# Patient Record
Sex: Male | Born: 1955 | Hispanic: Yes | Marital: Married | Smoking: Former smoker
Health system: Southern US, Community
[De-identification: ages and names within clinical notes are randomized; demographics above are authoritative.]

## PROBLEM LIST (undated history)

## (undated) DIAGNOSIS — D126 Benign neoplasm of colon, unspecified: Secondary | ICD-10-CM

## (undated) DIAGNOSIS — K219 Gastro-esophageal reflux disease without esophagitis: Secondary | ICD-10-CM

## (undated) DIAGNOSIS — E785 Hyperlipidemia, unspecified: Secondary | ICD-10-CM

## (undated) DIAGNOSIS — R51 Headache: Secondary | ICD-10-CM

## (undated) DIAGNOSIS — R519 Headache, unspecified: Secondary | ICD-10-CM

## (undated) DIAGNOSIS — T7840XA Allergy, unspecified, initial encounter: Secondary | ICD-10-CM

## (undated) HISTORY — DX: Hyperlipidemia, unspecified: E78.5

## (undated) HISTORY — DX: Headache, unspecified: R51.9

## (undated) HISTORY — DX: Gastro-esophageal reflux disease without esophagitis: K21.9

## (undated) HISTORY — DX: Benign neoplasm of colon, unspecified: D12.6

## (undated) HISTORY — PX: COLONOSCOPY: SHX174

## (undated) HISTORY — DX: Allergy, unspecified, initial encounter: T78.40XA

## (undated) HISTORY — PX: TONSILLECTOMY: SUR1361

## (undated) HISTORY — DX: Headache: R51

---

## 2012-05-01 ENCOUNTER — Ambulatory Visit (INDEPENDENT_AMBULATORY_CARE_PROVIDER_SITE_OTHER): Payer: BC Managed Care – PPO | Admitting: Family Medicine

## 2012-05-01 ENCOUNTER — Encounter: Payer: Self-pay | Admitting: Gastroenterology

## 2012-05-01 ENCOUNTER — Ambulatory Visit (INDEPENDENT_AMBULATORY_CARE_PROVIDER_SITE_OTHER): Payer: Self-pay | Admitting: Gastroenterology

## 2012-05-01 VITALS — BP 118/60 | HR 80 | Ht 68.0 in | Wt 134.0 lb

## 2012-05-01 VITALS — BP 113/78 | HR 108 | Temp 98.4°F | Resp 20 | Ht 68.0 in | Wt 135.0 lb

## 2012-05-01 DIAGNOSIS — K921 Melena: Secondary | ICD-10-CM

## 2012-05-01 DIAGNOSIS — D62 Acute posthemorrhagic anemia: Secondary | ICD-10-CM | POA: Insufficient documentation

## 2012-05-01 LAB — POCT CBC
HCT, POC: 31.6 % — AB (ref 43.5–53.7)
MCH, POC: 29.6 pg (ref 27–31.2)
MCV: 90.9 fL (ref 80–97)
MID (cbc): 0.4 (ref 0–0.9)
POC LYMPH PERCENT: 31.6 %L (ref 10–50)
Platelet Count, POC: 204 10*3/uL (ref 142–424)
RDW, POC: 14.8 %
WBC: 8.2 10*3/uL (ref 4.6–10.2)

## 2012-05-01 MED ORDER — PEG-KCL-NACL-NASULF-NA ASC-C 100 G PO SOLR: 1.0000 | Freq: Once | ORAL | Status: DC

## 2012-05-01 NOTE — Assessment & Plan Note (Addendum)
The patient has had an acute GI bleed. It is not certain whether this is due to  an upper GI or lower GI source. Intermittent use of Aleve stomach predisposes him to peptic ulcer disease.  Recommendations #1 begin Nexium 40 mg twice a day #2 upper endoscopy and colonoscopy-to be done within the next 24 hours  The patient was carefully instructed to go to the emergency room should he start passing clots or feel weak and dizzy

## 2012-05-01 NOTE — Patient Instructions (Signed)
Go to [facility]- appt to see Dr. Arlyce Dice at 2:15 [address] [place]

## 2012-05-01 NOTE — Progress Notes (Signed)
Patient Name: Dustin Schmidt Date of Birth: Mar 30, 1956 Medical Record Number: [id number] Gender: male Date of Encounter: 05/01/2012  History of Present Illness:  Dustin Schmidt is a 56 y.o. very pleasant male patient who presents with the following:  He is here to evaluate rectal bleeding and melena.  He has had a few stools over the last couple of days and they have ranged from bright red to black, and have smelled bad.  He has never had this before.  He has noted fatigue and feels a little dizzy especially over the last 24 hours.   His appetite has been ok, but he has avoided eating due to being afraid that he will then have a BM and "will have to see the blood" again. He has not had any vomiting or hematemesis.  He has not been taking a lot of NSAIDs or aspirin, no family history of colon cancer or other GI problems   There is no problem list on file for this patient.  No past medical history on file. No past surgical history on file. History  Substance Use Topics  . Smoking status: Never Smoker   . Smokeless tobacco: Not on file  . Alcohol Use: Not on file   No family history on file. Allergies  Allergen Reactions  . Alka-Seltzer Plus Sinus (Phenylephrine-Aspirin) Hives and Itching    Plain alka-seltzer.  . Shellfish Allergy Anaphylaxis    Shrimp.    Medication list has been reviewed and updated.  Review of Systems: As per HPI- otherwise negative.Marland Kitchen   Physical Examination: Filed Vitals:   05/01/12 1131  BP: 115/72  Pulse: 86  Temp: 98.4 F (36.9 C)  TempSrc: Oral  Resp: 20  Height: 5\' 8"  (1.727 m)  Weight: 135 lb (61.236 kg)    Body mass index is 20.53 kg/(m^2).  GEN: WDWN, NAD, Non-toxic, A & O x 3 HEENT: Atraumatic, Normocephalic. Neck supple. No masses, No LAD.  PEERL, oropharynx wnl  Ears and Nose: No external deformity. CV: RRR, No M/G/R. No JVD. No thrill. No extra heart sounds. PULM: CTA B, no wheezes, crackles, rhonchi. No  retractions. No resp. distress. No accessory muscle use. ABD: S, NT, ND, +BS. No rebound. No HSM. EXTR: No c/c/e NEURO Normal gait.  PSYCH: Normally interactive. Conversant. Not depressed or anxious appearing.  Calm demeanor.  Rectal exam: no gross bleeding, black stool on rectal exam, no fissure or pain to suggest a fissure  Results for orders placed in visit on 05/01/12  POCT CBC      Component Value Range   WBC 8.2  4.6 - 10.2 (K/uL)   Lymph, poc 2.6  0.6 - 3.4    POC LYMPH PERCENT 31.6  10 - 50 (%L)   MID (cbc) 0.4  0 - 0.9    POC MID % 4.3  0 - 12 (%M)   POC Granulocyte 5.3  2 - 6.9    Granulocyte percent 64.1  37 - 80 (%G)   RBC 3.48 (*) 4.69 - 6.13 (M/uL)   Hemoglobin 10.3 (*) 14.1 - 18.1 (g/dL)   HCT, POC 08.6 (*) 57.8 - 53.7 (%)   MCV 90.9  80 - 97 (fL)   MCH, POC 29.6  27 - 31.2 (pg)   MCHC 32.6  31.8 - 35.4 (g/dL)   RDW, POC 46.9     Platelet Count, POC 204  142 - 424 (K/uL)   MPV 9.1  0 - 99.8 (fL)  IFOBT (OCCULT BLOOD)  Component Value Range   IFOBT Positive     Assessment and Plan: 1. Melena  POCT CBC, IFOBT POC (occult bld, rslt in office)  56 year old male with rectal bleeding and anemia- called [facility] and they kindly agreed to see him at 2:15.  Patient and his girlfriend will be at [facility] then, they appreciate appointment

## 2012-05-01 NOTE — Patient Instructions (Signed)
Colonoscopy A colonoscopy is an exam to evaluate your entire colon. In this exam, your colon is cleansed. A long fiberoptic tube is inserted through your rectum and into your colon. The fiberoptic scope (endoscope) is a long bundle of enclosed and very flexible fibers. These fibers transmit light to the area examined and send images from that area to your caregiver. Discomfort is usually minimal. You may be given a drug to help you sleep (sedative) during or prior to the procedure. This exam helps to detect lumps (tumors), polyps, inflammation, and areas of bleeding. Your caregiver may also take a small piece of tissue (biopsy) that will be examined under a microscope. LET YOUR CAREGIVER KNOW ABOUT:   Allergies to food or medicine.   Medicines taken, including vitamins, herbs, eyedrops, over-the-counter medicines, and creams.   Use of steroids (by mouth or creams).   Previous problems with anesthetics or numbing medicines.   History of bleeding problems or blood clots.   Previous surgery.   Other health problems, including diabetes and kidney problems.   Possibility of pregnancy, if this applies.  BEFORE THE PROCEDURE   A clear liquid diet may be required for 2 days before the exam.   Ask your caregiver about changing or stopping your regular medications.   Liquid injections (enemas) or laxatives may be required.   A large amount of electrolyte solution may be given to you to drink over a short period of time. This solution is used to clean out your colon.   You should be present 60 minutes prior to your procedure or as directed by your caregiver.  AFTER THE PROCEDURE   If you received a sedative or pain relieving medication, you will need to arrange for someone to drive you home.   Occasionally, there is a little blood passed with the first bowel movement. Do not be concerned.  FINDING OUT THE RESULTS OF YOUR TEST Not all test results are available during your visit. If your test  results are not back during the visit, make an appointment with your caregiver to find out the results. Do not assume everything is normal if you have not heard from your caregiver or the medical facility. It is important for you to follow up on all of your test results. HOME CARE INSTRUCTIONS   It is not unusual to pass moderate amounts of gas and experience mild abdominal cramping following the procedure. This is due to air being used to inflate your colon during the exam. Walking or a warm pack on your belly (abdomen) may help.   You may resume all normal meals and activities after sedatives and medicines have worn off.   Only take over-the-counter or prescription medicines for pain, discomfort, or fever as directed by your caregiver. Do not use aspirin or blood thinners if a biopsy was taken. Consult your caregiver for medicine usage if biopsies were taken.  SEEK IMMEDIATE MEDICAL CARE IF:   You have a fever.   You pass large blood clots or fill a toilet with blood following the procedure. This may also occur 10 to 14 days following the procedure. This is more likely if a biopsy was taken.   You develop abdominal pain that keeps getting worse and cannot be relieved with medicine.  Document Released: 11/18/2000 Document Revised: 11/10/2011 Document Reviewed: 07/03/2008 ExitCare Patient Information 2012 [employer].  Upper GI Endoscopy Upper GI endoscopy means using a flexible scope to look at the esophagus, stomach, and upper small bowel. This is   done to make a diagnosis in people with heartburn, abdominal pain, or abnormal bleeding. Sometimes an endoscope is needed to remove foreign bodies or food that become stuck in the esophagus; it can also be used to take biopsy samples. For the best results, do not eat or drink for 8 hours before having your upper endoscopy.  To perform the endoscopy, you will probably be sedated and your throat will be numbed with a special spray. The endoscope is  then slowly passed down your throat (this will not interfere with your breathing). An endoscopy exam takes 15 to 30 minutes to complete and there is no real pain. Patients rarely remember much about the procedure. The results of the test may take several days if a biopsy or other test is taken.  You may have a sore throat after an endoscopy exam. Serious complications are very rare. Stick to liquids and soft foods until your pain is better. Do not drive a car or operate any dangerous equipment for at least 24 hours after being sedated. SEEK IMMEDIATE MEDICAL CARE IF:   You have severe throat pain.   You have shortness of breath.   You have bleeding problems.   You have a fever.   You have difficulty recovering from your sedation.  Document Released: 12/29/2004 Document Revised: 11/10/2011 Document Reviewed: 11/23/2008 ExitCare Patient Information 2012 [employer]. 

## 2012-05-01 NOTE — Progress Notes (Signed)
History of Present Illness: Dustin Schmidt is a pleasant 56 year old Hispanic male referred from [facility] for evaluation of GI bleeding. 2 days ago he passed bright red blood per rectum consisting of liquid red stool followed by a second stool. Yesterday he had a black, solid stool followed by liquid stool. Hemoglobin today was 10.1 with normal MCV.  He tested Hemoccult positive. He complains of easy fatigability. He takes Aleve at least one to 2 times a week.  There is no history of peptic ulcer disease or change of bowel habits prior to this recent event. He has occasional pyrosis.     Past Medical History  Diagnosis Date  . Generalized headaches   . GERD (gastroesophageal reflux disease)    Past Surgical History  Procedure Date  . Tonsillectomy    family history is negative for Colon cancer. Current Outpatient Prescriptions  Medication Sig Dispense Refill  . Naproxen Sodium (ALEVE) 220 MG CAPS Take by mouth as needed.       Allergies as of 05/01/2012 - Review Complete 05/01/2012  Allergen Reaction Noted  . Alka-seltzer plus sinus (phenylephrine-aspirin) Hives and Itching 05/01/2012  . Shellfish allergy Anaphylaxis 05/01/2012    reports that he has never smoked. He has never used smokeless tobacco. He reports that he drinks alcohol. He reports that he does not use illicit drugs.     Review of Systems: Pertinent positive and negative review of systems were noted in the above HPI section. All other review of systems were otherwise negative.  Vital signs were reviewed in today's medical record Physical Exam: General: Well developed , well nourished, no acute distress Head: Normocephalic and atraumatic Eyes:  sclerae anicteric, EOMI Ears: Normal auditory acuity Mouth: No deformity or lesions Neck: Supple, no masses or thyromegaly Lungs: Clear throughout to auscultation Heart: Regular rate and rhythm; no murmurs, rubs or bruits Abdomen: Soft, non tender and non distended. No  masses, hepatosplenomegaly or hernias noted. Normal Bowel sounds Rectal:deferred Musculoskeletal: Symmetrical with no gross deformities  Skin: No lesions on visible extremities Pulses:  Normal pulses noted Extremities: No clubbing, cyanosis, edema or deformities noted Neurological: Alert oriented x 4, grossly nonfocal Cervical Nodes:  No significant cervical adenopathy Inguinal Nodes: No significant inguinal adenopathy Psychological:  Alert and cooperative. Normal mood and affect

## 2012-05-01 NOTE — Assessment & Plan Note (Addendum)
The patient has not had any further frank bleeding requiring  admission. Plan to be begin iron supplementation after his procedures tomorrow.

## 2012-05-02 ENCOUNTER — Encounter: Payer: Self-pay | Admitting: Gastroenterology

## 2012-05-02 ENCOUNTER — Other Ambulatory Visit: Payer: Self-pay | Admitting: Gastroenterology

## 2012-05-02 ENCOUNTER — Ambulatory Visit (AMBULATORY_SURGERY_CENTER): Payer: BC Managed Care – PPO | Admitting: Gastroenterology

## 2012-05-02 VITALS — BP 127/76 | HR 80 | Temp 97.3°F | Resp 20 | Ht 68.0 in | Wt 134.0 lb

## 2012-05-02 DIAGNOSIS — D126 Benign neoplasm of colon, unspecified: Secondary | ICD-10-CM

## 2012-05-02 DIAGNOSIS — D62 Acute posthemorrhagic anemia: Secondary | ICD-10-CM

## 2012-05-02 DIAGNOSIS — K264 Chronic or unspecified duodenal ulcer with hemorrhage: Secondary | ICD-10-CM

## 2012-05-02 DIAGNOSIS — D649 Anemia, unspecified: Secondary | ICD-10-CM

## 2012-05-02 DIAGNOSIS — K921 Melena: Secondary | ICD-10-CM

## 2012-05-02 DIAGNOSIS — K2980 Duodenitis without bleeding: Secondary | ICD-10-CM

## 2012-05-02 MED ORDER — SODIUM CHLORIDE 0.9 % IV SOLN: 500.0000 mL | INTRAVENOUS | Status: DC

## 2012-05-02 MED ORDER — FERROUS SULFATE DRIED 200 (65 FE) MG PO TABS: ORAL_TABLET | ORAL | Status: DC

## 2012-05-02 NOTE — Op Note (Addendum)
[facility] [address] [place], [place]  [address]  ENDOSCOPY PROCEDURE REPORT  PATIENT:  Dustin Schmidt, Dustin Schmidt  MR#:  [id number] BIRTHDATE:  [birth year], 56 yrs. old  GENDER:  male  ENDOSCOPIST:  Barbette Hair. Arlyce Dice, MD Referred by:  Linus Weckerly Bellow, M.D.  PROCEDURE DATE:  05/02/2012 PROCEDURE:  EGD with biopsy, 43239 ASA CLASS:  Class II INDICATIONS:  hemorrhage of GI tract  MEDICATIONS:   There was residual sedation effect present from prior procedure., MAC sedation, administered by CRNA propofol 70mg IV, glycopyrrolate (Robinal) 0.2 mg IV, 0.6cc simethancone 0.6 cc PO TOPICAL ANESTHETIC:  DESCRIPTION OF PROCEDURE:   After the risks and benefits of the procedure were explained, informed consent was obtained.  The endoscope was introduced through the mouth and advanced to the third portion of the duodenum.  The instrument was slowly withdrawn as the mucosa was fully examined. <<PROCEDUREIMAGES>>  An ulcer was found in the bulb of the duodenum. Clean-based linear ulcer in bulb with surrounding deeply inflamed mucosa. Bx taken (see image2 and image3).  Otherwise the examination was normal (see image4, image6, and image7).    Retroflexed views revealed no abnormalities.    The scope was then withdrawn from the patient and the procedure completed.  COMPLICATIONS:  None  ENDOSCOPIC IMPRESSION:Duodenal Ulcer  RECOMMENDATIONS: 1) continue PPI - nexium twice a day for 2 weeks, then daily 2) Call office next 2-3 days to schedule an office appointment for 6 weeks 3) avoid NSAIDS  ______________________________ Barbette Hair. Arlyce Dice, MD  CC:  n. REVISED:  05/31/2012 03:03 PM eSIGNED:   Barbette Hair. Farid Grigorian at 05/31/2012 03:03 PM  Serita Kyle, [id number]

## 2012-05-02 NOTE — Patient Instructions (Signed)
YOU HAD AN ENDOSCOPIC PROCEDURE TODAY AT THE [facility]: Refer to the procedure report that was given to you for any specific questions about what was found during the examination.  If the procedure report does not answer your questions, please call your gastroenterologist to clarify.  If you requested that your care partner not be given the details of your procedure findings, then the procedure report has been included in a sealed envelope for you to review at your convenience later.  YOU SHOULD EXPECT: Some feelings of bloating in the abdomen. Passage of more gas than usual.  Walking can help get rid of the air that was put into your GI tract during the procedure and reduce the bloating. If you had a lower endoscopy (such as a colonoscopy or flexible sigmoidoscopy) you may notice spotting of blood in your stool or on the toilet paper. If you underwent a bowel prep for your procedure, then you may not have a normal bowel movement for a few days.  DIET: Your first meal following the procedure should be a light meal and then it is ok to progress to your normal diet.  A half-sandwich or bowl of soup is an example of a good first meal.  Heavy or fried foods are harder to digest and may make you feel nauseous or bloated.  Likewise meals heavy in dairy and vegetables can cause extra gas to form and this can also increase the bloating.  Drink plenty of fluids but you should avoid alcoholic beverages for 24 hours.  ACTIVITY: Your care partner should take you home directly after the procedure.  You should plan to take it easy, moving slowly for the rest of the day.  You can resume normal activity the day after the procedure however you should NOT DRIVE or use heavy machinery for 24 hours (because of the sedation medicines used during the test).    SYMPTOMS TO REPORT IMMEDIATELY: A gastroenterologist can be reached at any hour.  During normal business hours, 8:30 AM to 5:00 PM Monday through Friday,  call [phone].  After hours and on weekends, please call the GI answering service at [phone] who will take a message and have the physician on call contact you.   Following lower endoscopy (colonoscopy or flexible sigmoidoscopy):  Excessive amounts of blood in the stool  Significant tenderness or worsening of abdominal pains  Swelling of the abdomen that is new, acute  Fever of 100F or higher  Following upper endoscopy (EGD)  Vomiting of blood or coffee ground material  New chest pain or pain under the shoulder blades  Painful or persistently difficult swallowing  New shortness of breath  Fever of 100F or higher  Black, tarry-looking stools  FOLLOW UP: If any biopsies were taken you will be contacted by phone or by letter within the next 1-3 weeks.  Call your gastroenterologist if you have not heard about the biopsies in 3 weeks.  Our staff will call the home number listed on your records the next business day following your procedure to check on you and address any questions or concerns that you may have at that time regarding the information given to you following your procedure. This is a courtesy call and so if there is no answer at the home number and we have not heard from you through the emergency physician on call, we will assume that you have returned to your regular daily activities without incident.  SIGNATURES/CONFIDENTIALITY: You and/or your care   partner have signed paperwork which will be entered into your electronic medical record.  These signatures attest to the fact that that the information above on your After Visit Summary has been reviewed and is understood.  Full responsibility of the confidentiality of this discharge information lies with you and/or your care-partner.    Take Nexium twice a day for next 2 weeks.  Then take once daily.  Do not take any non-steroidal anti-inflammatory medication for next two weeks Gave Polyp, diverticulosis, and high fiber  diet information.  Call office in next couple of days to schedule appointment for 6 weeks. Have lab (CBC) drawn prior to appointment in 6 weeks.

## 2012-05-02 NOTE — Op Note (Signed)
[facility] [address] [place], [place]  [address]  COLONOSCOPY PROCEDURE REPORT  PATIENT:  Dustin Schmidt, Dustin Schmidt  MR#:  [id number] BIRTHDATE:  07-06-1956, 56 yrs. old  GENDER:  male ENDOSCOPIST:  Dustin Hair. Arlyce Dice, MD REF. BY:  Fern Canova Bellow, M.D. PROCEDURE DATE:  05/02/2012 PROCEDURE:  Colonoscopy with snare polypectomy ASA CLASS:  Class II INDICATIONS:  Gastrointestinal hemorrhage MEDICATIONS:   MAC sedation, administered by CRNA propofol 350mg IV  DESCRIPTION OF PROCEDURE:   After the risks benefits and alternatives of the procedure were thoroughly explained, informed consent was obtained.  Digital rectal exam was performed and revealed no abnormalities.   The LB [id number] endoscope was introduced through the anus and advanced to the cecum, which was identified by both the appendix and ileocecal valve, without limitations.  The quality of the prep was good, using MoviPrep. The instrument was then slowly withdrawn as the colon was fully examined. <<PROCEDUREIMAGES>>  FINDINGS:  A sessile polyp was found in the ascending colon. It was 5 mm in size. Polyp was snared without cautery. Retrieval was successful (see image3). snare polyp  Moderate diverticulosis was found in the sigmoid colon (see image5 and image4).  This was otherwise a normal examination of the colon (see image2 and image6).   Retroflexed views in the rectum revealed no abnormalities.    The time to cecum =  1) 4.75  minutes. The scope was then withdrawn in  1) 9.0  minutes from the cecum and the procedure completed. COMPLICATIONS:  None ENDOSCOPIC IMPRESSION: 1) 5 mm sessile polyp in the ascending colon 2) Moderate diverticulosis in the sigmoid colon 3) Otherwise normal examination RECOMMENDATIONS: 1) If the polyp(s) removed today are proven to be adenomatous (pre-cancerous) polyps, you will need a repeat colonoscopy in 5 years. Otherwise you should continue to follow colorectal  cancer screening guidelines for "routine risk" patients with colonoscopy in 10 years. You will receive a letter within 1-2 weeks with the results of your biopsy as well as final recommendations. Please call my office if you have not received a letter after 3 weeks. 2) EGD  REPEAT EXAM:  You will receive a letter from Dr. [name] in 1-2 weeks, after reviewing the final pathology, with followup recommendations.  ______________________________ Dustin Hair Arlyce Dice, MD  CC:  n. eSIGNED:   Barbette Hair. Numan Zylstra at 05/02/2012 04:08 PM  Serita Kyle, [phone]

## 2012-05-02 NOTE — Progress Notes (Signed)
Patient did not experience any of the following events: a burn prior to discharge; a fall within the facility; wrong site/side/patient/procedure/implant event; or a hospital transfer or hospital admission upon discharge from the facility. (G8907) Patient did not have preoperative order for IV antibiotic SSI prophylaxis. (G8918)  

## 2012-05-03 ENCOUNTER — Telehealth: Payer: Self-pay

## 2012-05-03 NOTE — Telephone Encounter (Signed)
  Follow up Call-  Call back number 05/02/2012  Post procedure Call Back phone  # [phone]  Permission to leave phone message Yes     Patient questions:  Do you have a fever, pain , or abdominal swelling? no Pain Score  0 *  Have you tolerated food without any problems? yes  Have you been able to return to your normal activities? yes  Do you have any questions about your discharge instructions: Diet   no Medications  no Follow up visit  no  Do you have questions or concerns about your Care? no  Actions: * If pain score is 4 or above: No action needed, pain <4.

## 2012-05-04 ENCOUNTER — Other Ambulatory Visit: Payer: Self-pay | Admitting: *Deleted

## 2012-05-04 ENCOUNTER — Ambulatory Visit: Payer: Self-pay | Admitting: Gastroenterology

## 2012-05-04 MED ORDER — ESOMEPRAZOLE MAGNESIUM 40 MG PO CPDR: 40.0000 mg | DELAYED_RELEASE_CAPSULE | Freq: Every day | ORAL | Status: DC

## 2012-05-04 NOTE — Telephone Encounter (Signed)
Sent script to pharmacy from fax

## 2012-05-09 ENCOUNTER — Telehealth: Payer: Self-pay | Admitting: Gastroenterology

## 2012-05-09 NOTE — Telephone Encounter (Signed)
Pt states his stool is dark, concerned about the color. Discussed with pt that he is taking iron and the iron causes dark stools. Instructed pt to call if he had black, tarry stools. Pt verbalized understanding.

## 2012-05-10 ENCOUNTER — Encounter: Payer: Self-pay | Admitting: Gastroenterology

## 2012-05-31 ENCOUNTER — Other Ambulatory Visit (INDEPENDENT_AMBULATORY_CARE_PROVIDER_SITE_OTHER): Payer: BC Managed Care – PPO

## 2012-05-31 DIAGNOSIS — D649 Anemia, unspecified: Secondary | ICD-10-CM

## 2012-05-31 LAB — CBC WITH DIFFERENTIAL/PLATELET
Basophils Absolute: 0 10*3/uL (ref 0.0–0.1)
Eosinophils Relative: 1.9 % (ref 0.0–5.0)
HCT: 39.8 % (ref 39.0–52.0)
Lymphocytes Relative: 27 % (ref 12.0–46.0)
Lymphs Abs: 1.6 10*3/uL (ref 0.7–4.0)
Monocytes Relative: 6.6 % (ref 3.0–12.0)
Platelets: 223 10*3/uL (ref 150.0–400.0)
RDW: 14.8 % — ABNORMAL HIGH (ref 11.5–14.6)
WBC: 5.9 10*3/uL (ref 4.5–10.5)

## 2012-06-01 ENCOUNTER — Ambulatory Visit (INDEPENDENT_AMBULATORY_CARE_PROVIDER_SITE_OTHER): Payer: BC Managed Care – PPO | Admitting: Gastroenterology

## 2012-06-01 ENCOUNTER — Encounter: Payer: Self-pay | Admitting: Gastroenterology

## 2012-06-01 VITALS — BP 102/64 | HR 80 | Ht 67.0 in | Wt 137.6 lb

## 2012-06-01 DIAGNOSIS — Z860100 Personal history of colon polyps, unspecified: Secondary | ICD-10-CM | POA: Insufficient documentation

## 2012-06-01 DIAGNOSIS — K921 Melena: Secondary | ICD-10-CM

## 2012-06-01 DIAGNOSIS — D62 Acute posthemorrhagic anemia: Secondary | ICD-10-CM

## 2012-06-01 NOTE — Assessment & Plan Note (Signed)
No etiology was determined by endoscopy or colonoscopy.  Recommendations #1 followup Hemoccults; if negative no further workup will be entertained

## 2012-06-01 NOTE — Patient Instructions (Addendum)
You will need to go to the basement today for your Hemoccult kit Please stop Iron for 1 week then do your Hemoccult test and submit it to the lab Call back if you have any questions

## 2012-06-01 NOTE — Assessment & Plan Note (Signed)
Plan followup colonoscopy 5 years 

## 2012-06-01 NOTE — Progress Notes (Signed)
History of Present Illness:  [name] has returned following upper and lower endoscopy. The former was normal. A adenomas polyp was removed at colonoscopy. He was placed on iron. Recent hemoglobin was 13.1. Stools are dark related to iron. He has no GI complaints.    Review of Systems: Pertinent positive and negative review of systems were noted in the above HPI section. All other review of systems were otherwise negative.    Current Medications, Allergies, Past Medical History, Past Surgical History, Family History and Social History were reviewed in [employer] electronic medical record  Vital signs were reviewed in today's medical record. Physical Exam: General: Well developed , well nourished, no acute distress

## 2012-08-09 ENCOUNTER — Other Ambulatory Visit: Payer: Self-pay | Admitting: Gastroenterology

## 2012-08-15 ENCOUNTER — Telehealth: Payer: Self-pay | Admitting: *Deleted

## 2012-08-15 NOTE — Telephone Encounter (Signed)
APPROVED FOR NEXIUM UNTIL 08/13/2012-08/14/2013

## 2013-06-17 ENCOUNTER — Telehealth: Payer: Self-pay | Admitting: Gastroenterology

## 2013-06-17 MED ORDER — ESOMEPRAZOLE MAGNESIUM 20 MG PO CPDR: DELAYED_RELEASE_CAPSULE | ORAL | Status: DC

## 2013-06-17 NOTE — Telephone Encounter (Signed)
Patient is asking to switch to Nexium 20 mg due to decrease cost per his insurance company. New rx sent per his request.

## 2013-07-09 ENCOUNTER — Other Ambulatory Visit: Payer: Self-pay | Admitting: Gastroenterology

## 2013-08-13 ENCOUNTER — Ambulatory Visit: Payer: BC Managed Care – PPO

## 2013-08-13 ENCOUNTER — Ambulatory Visit (INDEPENDENT_AMBULATORY_CARE_PROVIDER_SITE_OTHER): Payer: BC Managed Care – PPO | Admitting: Family Medicine

## 2013-08-13 VITALS — BP 124/72 | HR 69 | Temp 98.0°F | Resp 18 | Ht 68.5 in | Wt 140.0 lb

## 2013-08-13 DIAGNOSIS — R059 Cough, unspecified: Secondary | ICD-10-CM

## 2013-08-13 DIAGNOSIS — Z0000 Encounter for general adult medical examination without abnormal findings: Secondary | ICD-10-CM

## 2013-08-13 DIAGNOSIS — R05 Cough: Secondary | ICD-10-CM

## 2013-08-13 DIAGNOSIS — R319 Hematuria, unspecified: Secondary | ICD-10-CM

## 2013-08-13 DIAGNOSIS — J309 Allergic rhinitis, unspecified: Secondary | ICD-10-CM

## 2013-08-13 DIAGNOSIS — J302 Other seasonal allergic rhinitis: Secondary | ICD-10-CM

## 2013-08-13 LAB — POCT URINALYSIS DIPSTICK
Bilirubin, UA: NEGATIVE
Glucose, UA: NEGATIVE
Ketones, UA: NEGATIVE
Leukocytes, UA: NEGATIVE
Nitrite, UA: NEGATIVE
Protein, UA: NEGATIVE
Spec Grav, UA: 1.01
Urobilinogen, UA: 0.2
pH, UA: 7.5

## 2013-08-13 LAB — POCT CBC
Granulocyte percent: 58.2 % (ref 37–80)
HCT, POC: 48.9 % (ref 43.5–53.7)
Hemoglobin: 15.8 g/dL (ref 14.1–18.1)
Lymph, poc: 2.3 (ref 0.6–3.4)
MCH, POC: 28.7 pg (ref 27–31.2)
MCHC: 32.3 g/dL (ref 31.8–35.4)
MCV: 88.7 fL (ref 80–97)
MID (cbc): 0.5 (ref 0–0.9)
MPV: 10.2 fL (ref 0–99.8)
POC Granulocyte: 3.9 (ref 2–6.9)
POC LYMPH PERCENT: 33.8 % (ref 10–50)
POC MID %: 8 %M (ref 0–12)
Platelet Count, POC: 245 10*3/uL (ref 142–424)
RBC: 5.51 M/uL (ref 4.69–6.13)
RDW, POC: 15.4 %
WBC: 6.7 10*3/uL (ref 4.6–10.2)

## 2013-08-13 LAB — TSH: TSH: 1.743 u[IU]/mL (ref 0.350–4.500)

## 2013-08-13 LAB — COMPREHENSIVE METABOLIC PANEL WITH GFR
ALT: 14 U/L (ref 0–53)
AST: 17 U/L (ref 0–37)
Albumin: 4.7 g/dL (ref 3.5–5.2)
Alkaline Phosphatase: 123 U/L — ABNORMAL HIGH (ref 39–117)
Chloride: 102 meq/L (ref 96–112)
Potassium: 4.4 meq/L (ref 3.5–5.3)
Sodium: 137 meq/L (ref 135–145)
Total Protein: 7.3 g/dL (ref 6.0–8.3)

## 2013-08-13 LAB — COMPREHENSIVE METABOLIC PANEL
BUN: 12 mg/dL (ref 6–23)
CO2: 28 mEq/L (ref 19–32)
Calcium: 9.6 mg/dL (ref 8.4–10.5)
Creat: 0.92 mg/dL (ref 0.50–1.35)
Glucose, Bld: 88 mg/dL (ref 70–99)
Total Bilirubin: 0.9 mg/dL (ref 0.3–1.2)

## 2013-08-13 LAB — IFOBT (OCCULT BLOOD): IFOBT: NEGATIVE

## 2013-08-13 MED ORDER — MOMETASONE FUROATE 50 MCG/ACT NA SUSP: 2.0000 | Freq: Every day | NASAL | Status: DC

## 2013-08-13 NOTE — Progress Notes (Signed)
[facility]:  Office Visit  Chief Complaint:  Chief Complaint  Patient presents with  . Annual Exam  . issues with throat since July, wakes up in the middle of the    HPI: Dustin Schmidt is a 57 y.o. male who is here for CPE and also chronic cough.   1. Has congestion in throat, usually clear sputum , when he wakes up because he feels he has to clear his throat, he feels better but then it takes him 2-3 hrs to return to sleep. This started in June 2014 after traveling to [place], During the day does not bother him, he has some rattling in his throat, it is not wheezing, just feels like phlegm stuck in his throat and wants to get dislodge but cannot. He denies being a smoker, He has Allergies, Reflux. He tried vapor rub and robitussion without relief. He denies having TB, no bloody sputum or unintentional weightloss or night sweats. .   2. Has not had a PE in years. Colonoscopy June 2013 with polyps , path report shows Tubular adenoma. No other complaints except what is stated.  He is a Spanish Professor at [employer]  Past Medical History  Diagnosis Date  . Generalized headaches   . GERD (gastroesophageal reflux disease)   . Adenomatous colon polyp    Past Surgical History  Procedure Laterality Date  . Tonsillectomy     History   Social History  . Marital Status: Married    Spouse Name: N/A    Number of Children: 1  . Years of Education: N/A   Occupational History  . Professor    Social History Main Topics  . Smoking status: Never Smoker   . Smokeless tobacco: Never Used  . Alcohol Use: Yes     Comment: One drink daily   . Drug Use: No  . Sexual Activity: None   Other Topics Concern  . None   Social History Narrative   Daily caffeine    Family History  Problem Relation Age of Onset  . Colon cancer Neg Hx   . Heart disease Father   . Diabetes Mother   . Diabetes Maternal Grandfather    Allergies  Allergen Reactions  . Alka-Seltzer  Plus Sinus [Phenylephrine-Aspirin] Hives and Itching    Plain alka-seltzer.  . Shellfish Allergy Anaphylaxis    Shrimp.   Prior to Admission medications   Medication Sig Start Date End Date Taking? Authorizing Provider  acetaminophen (TYLENOL) 650 MG CR tablet Take 650 mg by mouth every 8 (eight) hours as needed.   Yes Historical Provider, MD  NEXIUM 40 MG capsule TAKE 1 CAPSULE BY MOUTH DAILY 07/09/13  Yes Louis Meckel, MD  esomeprazole (NEXIUM) 20 MG capsule Take 2 tablets po daily 06/17/13   Louis Meckel, MD  Ferrous Sulfate Dried (FEOSOL) 200 (65 FE) MG TABS Take 1 tab twice a day 05/02/12   Louis Meckel, MD     ROS: The patient denies fevers, chills, night sweats, unintentional weight loss, chest pain, palpitations, wheezing, dyspnea on exertion, nausea, vomiting, abdominal pain, dysuria, hematuria, melena, numbness, weakness, or tingling.   All other systems have been reviewed and were otherwise negative with the exception of those mentioned in the HPI and as above.    PHYSICAL EXAM: Filed Vitals:   08/13/13 1002  BP: 124/72  Pulse: 69  Temp: 98 F (36.7 C)  Resp: 18   Filed Vitals:   08/13/13 1002  Height: 5' 8.5" (1.74 m)  Weight: 140 lb (63.504 kg)   Body mass index is 20.98 kg/(m^2).  General: Alert, no acute distress HEENT:  Normocephalic, atraumatic, oropharynx patent. EOMI, PERRLA Cardiovascular:  Regular rate and rhythm, no rubs murmurs or gallops.  No Carotid bruits, radial pulse intact. No pedal edema.  Respiratory: Clear to auscultation bilaterally.  No wheezes, rales, or rhonchi.  No cyanosis, no use of accessory musculature GI: No organomegaly, abdomen is soft and non-tender, positive bowel sounds.  No masses. Skin: No rashes. Neurologic: Facial musculature symmetric. Psychiatric: Patient is appropriate throughout our interaction. Lymphatic: No cervical lymphadenopathy Musculoskeletal: Gait intact. Genitals nl-n hernia, no masses Rectal  exam-normal, no masses/hemoorhoids  LABS: Results for orders placed in visit on 08/13/13  IFOBT (OCCULT BLOOD)      Result Value Range   IFOBT Negative    POCT URINALYSIS DIPSTICK      Result Value Range   Color, UA yellow     Clarity, UA clear     Glucose, UA neg     Bilirubin, UA neg     Ketones, UA neg     Spec Grav, UA 1.010     Blood, UA trace-intact     pH, UA 7.5     Protein, UA neg     Urobilinogen, UA 0.2     Nitrite, UA neg     Leukocytes, UA Negative    POCT CBC      Result Value Range   WBC 6.7  4.6 - 10.2 K/uL   Lymph, poc 2.3  0.6 - 3.4   POC LYMPH PERCENT 33.8  10 - 50 %L   MID (cbc) 0.5  0 - 0.9   POC MID % 8.0  0 - 12 %M   POC Granulocyte 3.9  2 - 6.9   Granulocyte percent 58.2  37 - 80 %G   RBC 5.51  4.69 - 6.13 M/uL   Hemoglobin 15.8  14.1 - 18.1 g/dL   HCT, POC 16.1  09.6 - 53.7 %   MCV 88.7  80 - 97 fL   MCH, POC 28.7  27 - 31.2 pg   MCHC 32.3  31.8 - 35.4 g/dL   RDW, POC 04.5     Platelet Count, POC 245  142 - 424 K/uL   MPV 10.2  0 - 99.8 fL     EKG/XRAY:   Primary read interpreted by Dr. Conley Rolls at [facility]. No acute cardiopulmonary process   ASSESSMENT/PLAN: Encounter Diagnoses  Name Primary?  . Routine general medical examination at a health care facility Yes  . Seasonal allergies   . Cough   . Hematuria    Rx nasonex C/w GERD PPI Nexium 40 mg daily, he has been consistently on this and has had no changes in his cough sxs Will try a trial of zyrtec as well as nasonex to try to eliminated allergies and PND causes of cough F/u in 1 month for recheck of cough sxs , consider TB test if ongoing issue, and also hematuria Labs pending Gross sideeffects, risk and benefits, and alternatives of medications d/w patient. Patient is aware that all medications have potential sideeffects and we are unable to predict every sideeffect or drug-drug interaction that may occur.  Brette Cast PHUONG, DO 08/13/2013 11:21 AM

## 2013-08-14 ENCOUNTER — Encounter: Payer: Self-pay | Admitting: Family Medicine

## 2013-08-14 LAB — PSA: PSA: 1.68 ng/mL (ref <=4.00)

## 2013-11-15 ENCOUNTER — Other Ambulatory Visit: Payer: Self-pay | Admitting: Gastroenterology

## 2013-12-09 ENCOUNTER — Other Ambulatory Visit: Payer: Self-pay | Admitting: Gastroenterology

## 2014-01-17 ENCOUNTER — Ambulatory Visit (INDEPENDENT_AMBULATORY_CARE_PROVIDER_SITE_OTHER): Payer: BC Managed Care – PPO | Admitting: Physician Assistant

## 2014-01-17 VITALS — BP 138/90 | HR 92 | Temp 98.1°F | Resp 16 | Ht 69.75 in | Wt 138.0 lb

## 2014-01-17 DIAGNOSIS — R69 Illness, unspecified: Principal | ICD-10-CM

## 2014-01-17 DIAGNOSIS — J309 Allergic rhinitis, unspecified: Secondary | ICD-10-CM

## 2014-01-17 DIAGNOSIS — J111 Influenza due to unidentified influenza virus with other respiratory manifestations: Secondary | ICD-10-CM

## 2014-01-17 DIAGNOSIS — R509 Fever, unspecified: Secondary | ICD-10-CM

## 2014-01-17 DIAGNOSIS — R52 Pain, unspecified: Secondary | ICD-10-CM

## 2014-01-17 LAB — POCT CBC
GRANULOCYTE PERCENT: 59.7 % (ref 37–80)
HEMATOCRIT: 51.7 % (ref 43.5–53.7)
HEMOGLOBIN: 16.7 g/dL (ref 14.1–18.1)
Lymph, poc: 1 (ref 0.6–3.4)
MCH: 28.9 pg (ref 27–31.2)
MCHC: 32.3 g/dL (ref 31.8–35.4)
MCV: 89.4 fL (ref 80–97)
MID (CBC): 0.4 (ref 0–0.9)
MPV: 11 fL (ref 0–99.8)
PLATELET COUNT, POC: 200 10*3/uL (ref 142–424)
POC Granulocyte: 2.1 (ref 2–6.9)
POC LYMPH PERCENT: 28.6 %L (ref 10–50)
POC MID %: 11.7 %M (ref 0–12)
RBC: 5.78 M/uL (ref 4.69–6.13)
RDW, POC: 15.9 %
WBC: 3.6 10*3/uL — AB (ref 4.6–10.2)

## 2014-01-17 LAB — POCT INFLUENZA A/B
Influenza A, POC: NEGATIVE
Influenza B, POC: NEGATIVE

## 2014-01-17 NOTE — Progress Notes (Signed)
Subjective:    Patient ID: Dustin Schmidt, male    DOB: 02-10-1956, 58 y.o.   MRN: [id number]  HPI    Dustin Schmidt is a very pleasant 58 yr old male here with concern for illness.  He reports he began feeling really bad 5 days ago.  Fever, chills, body aches.  Tmax 100.61F.  +HA, congestion.  Denies runny nose, sneezing, coughing.  Denies SOB, wheezing.  Has had some diarrhea.  Has taken Tylenol prn for symptoms - cannot take advil due to prior ulcer.  Also took some otc medicine from [place].    His partner has cold symptoms, but otherwise household contacts are well.  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  No flu shot this season.  He requests a referral to ENT to discuss ongoing allergy symptoms  Review of Systems  Constitutional: Positive for fever and chills.  HENT: Positive for congestion. Negative for rhinorrhea, sneezing and sore throat.   Respiratory: Negative for cough, shortness of breath and wheezing.   Cardiovascular: Negative.   Gastrointestinal: Positive for diarrhea. Negative for abdominal pain.  Musculoskeletal: Positive for arthralgias and myalgias.  Skin: Negative.   Neurological: Positive for headaches.       Objective:   Physical Exam  Vitals reviewed. Constitutional: He is oriented to person, place, and time. He appears well-developed and well-nourished. No distress.  HENT:  Head: Normocephalic and atraumatic.  Right Ear: Tympanic membrane and ear canal normal.  Left Ear: Tympanic membrane and ear canal normal.  Mouth/Throat: Uvula is midline, oropharynx is clear and moist and mucous membranes are normal.  Eyes: Conjunctivae are normal. No scleral icterus.  Neck: Neck supple.  Cardiovascular: Regular rhythm and normal heart sounds.  Tachycardia present.   Pulmonary/Chest: Effort normal and breath sounds normal. He has no wheezes. He has no rales.  Abdominal: Soft. There is no tenderness.  Lymphadenopathy:    He has  no cervical adenopathy.  Neurological: He is alert and oriented to person, place, and time.  Skin: Skin is warm and dry.  Psychiatric: He has a normal mood and affect. His behavior is normal.    Results for orders placed in visit on 01/17/14  POCT CBC      Result Value Ref Range   WBC 3.6 (*) 4.6 - 10.2 K/uL   Lymph, poc 1.0  0.6 - 3.4   POC LYMPH PERCENT 28.6  10 - 50 %L   MID (cbc) 0.4  0 - 0.9   POC MID % 11.7  0 - 12 %M   POC Granulocyte 2.1  2 - 6.9   Granulocyte percent 59.7  37 - 80 %G   RBC 5.78  4.69 - 6.13 M/uL   Hemoglobin 16.7  14.1 - 18.1 g/dL   HCT, POC 51.7  43.5 - 53.7 %   MCV 89.4  80 - 97 fL   MCH, POC 28.9  27 - 31.2 pg   MCHC 32.3  31.8 - 35.4 g/dL   RDW, POC 15.9     Platelet Count, POC 200  142 - 424 K/uL   MPV 11.0  0 - 99.8 fL  POCT INFLUENZA A/B      Result Value Ref Range   Influenza A, POC Negative     Influenza B, POC Negative         Assessment & Plan:  Influenza-like illness  Body aches - Plan: POCT CBC, POCT Influenza A/B  Fever,  unspecified - Plan: POCT CBC, POCT Influenza A/B  Allergic rhinitis - Plan: Ambulatory referral to ENT   Dustin Schmidt is a very pleasant 58 yr old male here with influenza-like illness.  Rapid flu is negative, but pt is 5 days out from symptom onset.  With associated slight leukopenia, suspect this is in fact influenza.  Discussed that we cannot use Tamiflu at this time which pt understands.  Continue Tylenol for fever, body aches.  Avoid NSAIDs due to prior ulcer.  OOW until fever free for 24 hours without medication.  Pt asks about Tamiflu for his family.  We discussed that he was most infectious 5-7 days ago, so at this point if family is well, likely no need for treatment.  If anyone develops flu like symptoms, he will call and we can send tamiflu.  Pt requests ENT to discuss ongoing allergy symptoms.  I suggested an allergy referral, but pt prefers to see ENT first.  I have placed the referral per his  request.  Pt also asks about discontinuing Nexium.  He has taken for nearly 2 yrs now.  Concerned about adverse effects from long term use.  He denies hx GERD.  Was started on PPI when he developed an ulcer after NSAID use.  I think it is reasonable for him to discontinue nexium at this point.  If he develops GERD symptoms would restart.  Pt to call or RTC if worsening or not improving  E. Natividad Brood MHS, PA-C [facility] 2/13/20157:06 PM

## 2014-01-17 NOTE — Patient Instructions (Signed)
Continue taking Tylenol if needed for headache, fever, body aches  Drink plenty of fluids (water is best!) and get plenty of rest  Out of work until you are fever free without Tylenol  If any symptoms are worsening or not improving, please let us know  You will get a call about scheduling your ENT appointment   Influenza, Adult Influenza ("the flu") is a viral infection of the respiratory tract. It occurs more often in winter months because people spend more time in close contact with one another. Influenza can make you feel very sick. Influenza easily spreads from person to person (contagious). CAUSES  Influenza is caused by a virus that infects the respiratory tract. You can catch the virus by breathing in droplets from an infected person's cough or sneeze. You can also catch the virus by touching something that was recently contaminated with the virus and then touching your mouth, nose, or eyes. SYMPTOMS  Symptoms typically last 4 to 10 days and may include:  Fever.  Chills.  Headache, body aches, and muscle aches.  Sore throat.  Chest discomfort and cough.  Poor appetite.  Weakness or feeling tired.  Dizziness.  Nausea or vomiting. DIAGNOSIS  Diagnosis of influenza is often made based on your history and a physical exam. A nose or throat swab test can be done to confirm the diagnosis. RISKS AND COMPLICATIONS You may be at risk for a more severe case of influenza if you smoke cigarettes, have diabetes, have chronic heart disease (such as heart failure) or lung disease (such as asthma), or if you have a weakened immune system. Elderly people and pregnant women are also at risk for more serious infections. The most common complication of influenza is a lung infection (pneumonia). Sometimes, this complication can require emergency medical care and may be life-threatening. PREVENTION  An annual influenza vaccination (flu shot) is the best way to avoid getting influenza. An  annual flu shot is now routinely recommended for all adults in the [place] TREATMENT  In mild cases, influenza goes away on its own. Treatment is directed at relieving symptoms. For more severe cases, your caregiver may prescribe antiviral medicines to shorten the sickness. Antibiotic medicines are not effective, because the infection is caused by a virus, not by bacteria. HOME CARE INSTRUCTIONS  Only take over-the-counter or prescription medicines for pain, discomfort, or fever as directed by your caregiver.  Use a cool mist humidifier to make breathing easier.  Get plenty of rest until your temperature returns to normal. This usually takes 3 to 4 days.  Drink enough fluids to keep your urine clear or pale yellow.  Cover your mouth and nose when coughing or sneezing, and wash your hands well to avoid spreading the virus.  Stay home from work or school until your fever has been gone for at least 1 full day. SEEK MEDICAL CARE IF:   You have chest pain or a deep cough that worsens or produces more mucus.  You have nausea, vomiting, or diarrhea. SEEK IMMEDIATE MEDICAL CARE IF:   You have difficulty breathing, shortness of breath, or your skin or nails turn bluish.  You have severe neck pain or stiffness.  You have a severe headache, facial pain, or earache.  You have a worsening or recurring fever.  You have nausea or vomiting that cannot be controlled. MAKE SURE YOU:  Understand these instructions.  Will watch your condition.  Will get help right away if you are not doing well or get worse.  Document Released: 11/18/2000 Document Revised: 05/22/2012 Document Reviewed: 02/20/2012 [facility] Patient Information 2014 [place], [place].

## 2015-09-11 ENCOUNTER — Telehealth: Payer: Self-pay | Admitting: Gastroenterology

## 2015-09-11 NOTE — Telephone Encounter (Signed)
Has had this issue in the past. He has had persistent and worsening indigestion and gas after he eats. He has modified his diet, but states no improvement. Recently restarted OTC Nexium but he has difficulty in remembering to take the medication in the morning on an empty stomach as directed. He really wants to be seen. Denies bowel movement changes, vomiting or apetite change. Appointment scheduled for 09/22/15.

## 2015-09-22 ENCOUNTER — Ambulatory Visit: Payer: BC Managed Care – PPO | Admitting: Physician Assistant

## 2015-12-25 ENCOUNTER — Encounter: Payer: Self-pay | Admitting: Family Medicine

## 2015-12-30 ENCOUNTER — Encounter: Payer: Self-pay | Admitting: Family Medicine

## 2017-03-02 ENCOUNTER — Encounter: Payer: Self-pay | Admitting: Gastroenterology

## 2018-09-25 ENCOUNTER — Ambulatory Visit: Payer: BC Managed Care – PPO | Admitting: Family Medicine

## 2018-09-25 ENCOUNTER — Encounter: Payer: Self-pay | Admitting: Gastroenterology

## 2018-09-25 ENCOUNTER — Encounter: Payer: Self-pay | Admitting: Family Medicine

## 2018-09-25 VITALS — BP 120/80 | HR 79 | Temp 98.3°F | Resp 12 | Ht 69.75 in | Wt 158.4 lb

## 2018-09-25 DIAGNOSIS — Z0000 Encounter for general adult medical examination without abnormal findings: Secondary | ICD-10-CM | POA: Diagnosis not present

## 2018-09-25 DIAGNOSIS — M722 Plantar fascial fibromatosis: Secondary | ICD-10-CM | POA: Diagnosis not present

## 2018-09-25 DIAGNOSIS — Z13220 Encounter for screening for lipoid disorders: Secondary | ICD-10-CM

## 2018-09-25 DIAGNOSIS — Z125 Encounter for screening for malignant neoplasm of prostate: Secondary | ICD-10-CM | POA: Diagnosis not present

## 2018-09-25 DIAGNOSIS — Z1211 Encounter for screening for malignant neoplasm of colon: Secondary | ICD-10-CM

## 2018-09-25 DIAGNOSIS — Z1159 Encounter for screening for other viral diseases: Secondary | ICD-10-CM

## 2018-09-25 DIAGNOSIS — Z131 Encounter for screening for diabetes mellitus: Secondary | ICD-10-CM

## 2018-09-25 DIAGNOSIS — Z9189 Other specified personal risk factors, not elsewhere classified: Secondary | ICD-10-CM

## 2018-09-25 LAB — BASIC METABOLIC PANEL
BUN: 15 mg/dL (ref 6–23)
CALCIUM: 9 mg/dL (ref 8.4–10.5)
CO2: 28 meq/L (ref 19–32)
CREATININE: 0.95 mg/dL (ref 0.40–1.50)
Chloride: 103 mEq/L (ref 96–112)
GFR: 84.96 mL/min (ref >60.00)
Glucose, Bld: 89 mg/dL (ref 70–99)
Potassium: 4.2 mEq/L (ref 3.5–5.1)
Sodium: 138 mEq/L (ref 135–145)

## 2018-09-25 LAB — LIPID PANEL
Cholesterol: 183 mg/dL (ref 0–200)
HDL: 55.9 mg/dL (ref >39.00)
LDL CALC: 104 mg/dL — AB (ref 0–99)
NonHDL: 127.49
Total CHOL/HDL Ratio: 3
Triglycerides: 118 mg/dL (ref 0.0–149.0)
VLDL: 23.6 mg/dL (ref 0.0–40.0)

## 2018-09-25 LAB — HEMOGLOBIN A1C: Hgb A1c MFr Bld: 5.4 % (ref 4.6–6.5)

## 2018-09-25 LAB — PSA: PSA: 2.59 ng/mL (ref 0.10–4.00)

## 2018-09-25 NOTE — Patient Instructions (Addendum)
A few things to remember from today's visit:   No diagnosis found.   At least 150 minutes of moderate exercise per week, daily brisk walking for 15-30 min is a good exercise option. Healthy diet low in saturated (animal) fats and sweets and consisting of fresh fruits and vegetables, lean meats such as fish and white chicken and whole grains.  - Vaccines:  Tdap vaccine every 10 years.  Shingles vaccine recommended at age 63, could be given after 63 years of age but not sure about insurance coverage.  Pneumonia vaccines:  Prevnar 64 at 68 and Pneumovax at 41.   -Screening recommendations for low/normal risk males:  Screening for diabetes at age 38 and every 3 years. Earlier screening if cardiovascular risk factors.   Lipid screening at 35 and every 3 years. Screening starts in younger males with cardiovascular risk factors.  Colon cancer screening at age 55 and until age 41.  Prostate cancer screening: some controversy, starts usually at 70: Rectal exam and PSA.  Aortic Abdominal Aneurism once between 37 and 28 years old if ever smoker.  Also recommended:  1. Dental visit- Brush and floss your teeth twice daily; visit your dentist twice a year. 2. Eye doctor- Get an eye exam at least every 2 years. 3. Helmet use- Always wear a helmet when riding a bicycle, motorcycle, rollerblading or skateboarding. 4. Safe sex- If you may be exposed to sexually transmitted infections, use a condom. 5. Seat belts- Seat belts can save your live; always wear one. 6. Smoke/Carbon Monoxide detectors- These detectors need to be installed on the appropriate level of your home. Replace batteries at least once a year. 7. Skin cancer- When out in the sun please cover up and use sunscreen 15 SPF or higher. 8. Violence- If anyone is threatening or hurting you, please tell your healthcare provider.  9. Drink alcohol in moderation- Limit alcohol intake to one drink or less per day. Never drink and  drive.   Plantar Fasciitis Plantar fasciitis is a painful foot condition that affects the heel. It occurs when the band of tissue that connects the toes to the heel bone (plantar fascia) becomes irritated. This can happen after exercising too much or doing other repetitive activities (overuse injury). The pain from plantar fasciitis can range from mild irritation to severe pain that makes it difficult for you to walk or move. The pain is usually worse in the morning or after you have been sitting or lying down for a while. What are the causes? This condition may be caused by:  Standing for long periods of time.  Wearing shoes that do not fit.  Doing high-impact activities, including running, aerobics, and ballet.  Being overweight.  Having an abnormal way of walking (gait).  Having tight calf muscles.  Having high arches in your feet.  Starting a new athletic activity.  What are the signs or symptoms? The main symptom of this condition is heel pain. Other symptoms include:  Pain that gets worse after activity or exercise.  Pain that is worse in the morning or after resting.  Pain that goes away after you walk for a few minutes.  How is this diagnosed? This condition may be diagnosed based on your signs and symptoms. Your health care provider will also do a physical exam to check for:  A tender area on the bottom of your foot.  A high arch in your foot.  Pain when you move your foot.  Difficulty moving your foot.  You may also need to have imaging studies to confirm the diagnosis. These can include:  X-rays.  Ultrasound.  MRI.  How is this treated? Treatment for plantar fasciitis depends on the severity of the condition. Your treatment may include:  Rest, ice, and over-the-counter pain medicines to manage your pain.  Exercises to stretch your calves and your plantar fascia.  A splint that holds your foot in a stretched, upward position while you sleep (night  splint).  Physical therapy to relieve symptoms and prevent problems in the future.  Cortisone injections to relieve severe pain.  Extracorporeal shock wave therapy (ESWT) to stimulate damaged plantar fascia with electrical impulses. It is often used as a last resort before surgery.  Surgery, if other treatments have not worked after 12 months.  Follow these instructions at home:  Take medicines only as directed by your health care provider.  Avoid activities that cause pain.  Roll the bottom of your foot over a bag of ice or a bottle of cold water. Do this for 20 minutes, 3-4 times a day.  Perform simple stretches as directed by your health care provider.  Try wearing athletic shoes with air-sole or gel-sole cushions or soft shoe inserts.  Wear a night splint while sleeping, if directed by your health care provider.  Keep all follow-up appointments with your health care provider. How is this prevented?  Do not perform exercises or activities that cause heel pain.  Consider finding low-impact activities if you continue to have problems.  Lose weight if you need to. The best way to prevent plantar fasciitis is to avoid the activities that aggravate your plantar fascia. Contact a health care provider if:  Your symptoms do not go away after treatment with home care measures.  Your pain gets worse.  Your pain affects your ability to move or do your daily activities. This information is not intended to replace advice given to you by your health care provider. Make sure you discuss any questions you have with your health care provider. Document Released: 08/16/2001 Document Revised: 04/25/2016 Document Reviewed: 10/01/2014 [employer]vier Interactive Patient Education  [employer].

## 2018-09-25 NOTE — Progress Notes (Signed)
HPI:   [name] is a 62 y.o. male, who is here today to establish care.  Former PCP: Dr Lorelei Pont Last preventive routine visit: 2014  Chronic medical problems: Hx of GI bleed in 2013 and allergic rhinitis. He denies history of hypertension, hyperlipidemia, or diabetes.   Concerns today: Foot pain.  Since 04/2018 he has had left heel pain, which is worse first thing in the morning when he gets up and improved some after a few steps. Gradual onset, it was mild initially and infrequent. A few weeks ago he got a shoe insert, which has helped with pain and problem seems to be improving. He has not noted local edema or erythema. No history of trauma.   He also needs a physical before the end of the year.  He lives with his wife. He exercises regularly, currently his riding stationary bike, and until 4 months ago he was playing tennis a few times per week. He follows a healthy diet.  Last colonoscopy 04/2012, 5 years follow-up was recommended.  He received a letter a year ago but has not arranged appointment. He is not sure about prostate cancer screening.  Nocturia x2, depending of how much tea or water he drinks around bedtime,mainly tea. Problem has been stable for years.  Denies dysuria,increased urinary frequency, gross hematuria,or decreased urine output.  Former smoker, he smoked from age 67 to 4 yo, 1/2 PPD.  He states that his wife has suggested to have chest CT to screen for lung cancer. He denies cough, wheezing, dyspnea, or chest pain. No abnormal weight loss or hemoptysis.   Review of Systems  Constitutional: Negative for activity change, appetite change, fatigue, fever and unexpected weight change.  HENT: Negative for dental problem, nosebleeds, sore throat, trouble swallowing and voice change.   Eyes: Negative for redness and visual disturbance.  Respiratory: Negative for cough, shortness of breath and wheezing.   Cardiovascular: Negative  for chest pain, palpitations and leg swelling.  Gastrointestinal: Negative for abdominal pain, blood in stool, nausea and vomiting.  Endocrine: Negative for cold intolerance, heat intolerance, polydipsia, polyphagia and polyuria.  Genitourinary: Negative for decreased urine volume, dysuria, genital sores, hematuria and testicular pain.  Musculoskeletal: Positive for arthralgias. Negative for back pain, joint swelling and myalgias.  Skin: Negative for color change and rash.  Allergic/Immunologic: Positive for environmental allergies.  Neurological: Negative for syncope, weakness, numbness and headaches.  Hematological: Negative for adenopathy. Does not bruise/bleed easily.  Psychiatric/Behavioral: Negative for sleep disturbance. The patient is not nervous/anxious.   All other systems reviewed and are negative.     Current Outpatient Medications on File Prior to Visit  Medication Sig Dispense Refill  . Ascorbic Acid (VITAMIN C) 1000 MG tablet Take 1,000 mg by mouth as needed.    . Garlic 209 MG TABS Take 200 mg by mouth once a week.    . Nettle, Urtica Dioica, (NETTLE LEAF PO) Take 900 mg by mouth as needed.     No current facility-administered medications on file prior to visit.      Past Medical History:  Diagnosis Date  . Adenomatous colon polyp   . Allergy   . Generalized headaches   . GERD (gastroesophageal reflux disease)    Allergies  Allergen Reactions  . Alka-Seltzer Plus Sinus [Phenylephrine-Aspirin] Hives and Itching    Plain alka-seltzer.  . Shellfish Allergy Anaphylaxis    Shrimp.    Family History  Problem Relation Age of Onset  . Heart  disease Father   . Early death Father   . Heart attack Father   . Diabetes Mother   . Hearing loss Mother   . Diabetes Maternal Grandfather   . Colon cancer Neg Hx     Social History   Socioeconomic History  . Marital status: Married    Spouse name: Not on file  . Number of children: 1  . Years of education: Not on  file  . Highest education level: Not on file  Occupational History  . Occupation: Professor    Fish farm manager: [employer]  Social Needs  . Financial resource strain: Not on file  . Food insecurity:    Worry: Not on file    Inability: Not on file  . Transportation needs:    Medical: Not on file    Non-medical: Not on file  Tobacco Use  . Smoking status: Never Smoker  . Smokeless tobacco: Never Used  Substance and Sexual Activity  . Alcohol use: Yes    Comment: One drink daily   . Drug use: No  . Sexual activity: Not on file  Lifestyle  . Physical activity:    Days per week: Not on file    Minutes per session: Not on file  . Stress: Not on file  Relationships  . Social connections:    Talks on phone: Not on file    Gets together: Not on file    Attends religious service: Not on file    Active member of club or organization: Not on file    Attends meetings of clubs or organizations: Not on file    Relationship status: Not on file  Other Topics Concern  . Not on file  Social History Narrative   Daily caffeine     Vitals:   09/25/18 0749  BP: 120/80  Pulse: 79  Resp: 12  Temp: 98.3 F (36.8 C)  SpO2: 97%    Body mass index is 22.89 kg/m.  Physical Exam  Nursing note and vitals reviewed. Constitutional: He is oriented to person, place, and time. He appears well-developed and well-nourished. No distress.  HENT:  Head: Normocephalic and atraumatic.  Right Ear: Tympanic membrane, external ear and ear canal normal.  Left Ear: Tympanic membrane, external ear and ear canal normal.  Mouth/Throat: Oropharynx is clear and moist and mucous membranes are normal.  Eyes: Pupils are equal, round, and reactive to light. Conjunctivae and EOM are normal.  Neck: Normal range of motion. No tracheal deviation present. No thyromegaly present.  Cardiovascular: Normal rate and regular rhythm.  No murmur heard. Pulses:      Dorsalis pedis pulses are 2+ on the right side, and 2+ on the left  side.  Respiratory: Effort normal and breath sounds normal. No respiratory distress.  GI: Soft. He exhibits no mass. There is no hepatomegaly. There is no tenderness.  Genitourinary:  Genitourinary Comments: Refused,no concerns.  Musculoskeletal: He exhibits no edema or tenderness.       Feet:  No major deformities appreciated and no signs of synovitis. Left foot:Tenderness upon palpation of heel mainly at the medial insertion of plantar fascia into calcaneous. No discomfort with palpation along planta fascia towards forefoot.  Dorsal flexion of first MTP does not elicits pain.  No edema or erythema appreciated on area.   Lymphadenopathy:    He has no cervical adenopathy.       Right: No supraclavicular adenopathy present.       Left: No supraclavicular adenopathy present.  Neurological: He is  alert and oriented to person, place, and time. He has normal strength. No cranial nerve deficit or sensory deficit. Coordination and gait normal.  Reflex Scores:      Bicep reflexes are 2+ on the right side and 2+ on the left side.      Patellar reflexes are 2+ on the right side and 2+ on the left side. Skin: Skin is warm. No rash noted. No erythema.  Psychiatric: He has a normal mood and affect. Cognition and memory are normal.  Well-groomed, good eye contact.      ASSESSMENT AND PLAN:  Mr. Herold was seen today for establish care and annual exam.  Orders Placed This Encounter  Procedures  . Basic metabolic panel  . Hemoglobin A1c  . Lipid panel  . Hepatitis C antibody screen  . PSA(Must document that pt has been informed of limitations of PSA testing.)  . Ambulatory referral to Gastroenterology   Lab Results  Component Value Date   PSA 2.59 09/25/2018   Lab Results  Component Value Date   CHOL 183 09/25/2018   HDL 55.90 09/25/2018   LDLCALC 104 (H) 09/25/2018   TRIG 118.0 09/25/2018   CHOLHDL 3 09/25/2018   Lab Results  Component Value Date   CREATININE 0.95 09/25/2018     BUN 15 09/25/2018   NA 138 09/25/2018   K 4.2 09/25/2018   CL 103 09/25/2018   CO2 28 09/25/2018   Lab Results  Component Value Date   HGBA1C 5.4 09/25/2018     Routine general medical examination at a health care facility We discussed the importance of regular physical activity and healthy diet for prevention of chronic illness and/or complications. Preventive guidelines reviewed. We discussed recommendations in regard to lung cancer screening and AAA. Vaccination: Refused Tdap,shingrinx, and influenza vaccine.  Next CPE in a year.  The 10-year ASCVD risk score Mikey Bussing DC Brooke Bonito., et al., 2013) is: 8.5%   Values used to calculate the score:     Age: 30 years     Sex: Male     Is Non-Hispanic African American: No     Diabetic: No     Tobacco smoker: No     Systolic Blood Pressure: 814 mmHg     Is BP treated: No     HDL Cholesterol: 55.9 mg/dL     Total Cholesterol: 183 mg/dL  Screening for lipid disorders -     Lipid panel  Diabetes mellitus screening -     Basic metabolic panel -     Hemoglobin A1c  Prostate cancer screening -     PSA(Must document that pt has been informed of limitations of PSA testing.)  Colon cancer screening -     Ambulatory referral to Gastroenterology  Encounter for HCV screening test for high risk patient -     Hepatitis C antibody screen  Plantar fasciitis Educated about Dx,prognosis,and treatment options. Stretching exercises,continue wearing shoe inserts. Night splint may also help. It seems to be improving but if not greatly better or resolved in 3-4 weeks recommend podiatrist evaluation.       Reeya Bound G. Martinique, MD  [facility]. [place] office.

## 2018-09-26 LAB — HEPATITIS C ANTIBODY
HEP C AB: NONREACTIVE
SIGNAL TO CUT-OFF: 0.03 (ref <1.00)

## 2018-10-23 ENCOUNTER — Encounter: Payer: Self-pay | Admitting: Gastroenterology

## 2018-10-23 ENCOUNTER — Telehealth: Payer: Self-pay | Admitting: Gastroenterology

## 2018-10-23 ENCOUNTER — Ambulatory Visit (AMBULATORY_SURGERY_CENTER): Payer: Self-pay

## 2018-10-23 VITALS — Ht 67.0 in | Wt 159.6 lb

## 2018-10-23 DIAGNOSIS — Z86010 Personal history of colonic polyps: Secondary | ICD-10-CM

## 2018-10-23 MED ORDER — NA SULFATE-K SULFATE-MG SULF 17.5-3.13-1.6 GM/177ML PO SOLN: 1.0000 | Freq: Once | ORAL | 0 refills | Status: AC

## 2018-10-23 NOTE — Progress Notes (Signed)
Per pt, no allergies to soy or egg products.Pt not taking any weight loss meds or using  O2 at home.  Pt refused emmi video. 

## 2018-10-23 NOTE — Telephone Encounter (Signed)
Pt's wife called to inform that pt gave [place] the wrong pharmacy. Correct pharmacy is [facility] on [address]. Pls send suprep over there.

## 2018-10-26 MED ORDER — NA SULFATE-K SULFATE-MG SULF 17.5-3.13-1.6 GM/177ML PO SOLN: 1.0000 | Freq: Once | ORAL | 0 refills | Status: AC

## 2018-10-26 NOTE — Telephone Encounter (Signed)
Suprep bowel prep was sent to [facility] on [address] per patient's request. Dustin Schmidt

## 2018-11-06 ENCOUNTER — Encounter: Payer: Self-pay | Admitting: Gastroenterology

## 2018-11-06 ENCOUNTER — Ambulatory Visit (AMBULATORY_SURGERY_CENTER): Payer: BC Managed Care – PPO | Admitting: Gastroenterology

## 2018-11-06 VITALS — BP 145/94 | HR 58 | Temp 97.7°F | Resp 14 | Ht 69.75 in | Wt 158.0 lb

## 2018-11-06 DIAGNOSIS — D122 Benign neoplasm of ascending colon: Secondary | ICD-10-CM | POA: Diagnosis not present

## 2018-11-06 DIAGNOSIS — D123 Benign neoplasm of transverse colon: Secondary | ICD-10-CM | POA: Diagnosis not present

## 2018-11-06 DIAGNOSIS — Z1211 Encounter for screening for malignant neoplasm of colon: Secondary | ICD-10-CM

## 2018-11-06 DIAGNOSIS — Z86010 Personal history of colonic polyps: Secondary | ICD-10-CM

## 2018-11-06 MED ORDER — SODIUM CHLORIDE 0.9 % IV SOLN: 500.0000 mL | Freq: Once | INTRAVENOUS | Status: DC

## 2018-11-06 NOTE — Op Note (Signed)
[facility] Patient Name: Dustin Schmidt Procedure Date: 11/06/2018 8:39 AM MRN: [id number] Endoscopist: Mauri Pole , MD Age: 62 Referring MD:  Date of Birth: 05/16/1956 Gender: Male Account #: [id number] Procedure:                Colonoscopy Indications:              Surveillance: Personal history of adenomatous                            polyps on last colonoscopy > 5 years ago Medicines:                Monitored Anesthesia Care Procedure:                Pre-Anesthesia Assessment:                           - Prior to the procedure, a History and Physical                            was performed, and patient medications and                            allergies were reviewed. The patient's tolerance of                            previous anesthesia was also reviewed. The risks                            and benefits of the procedure and the sedation                            options and risks were discussed with the patient.                            All questions were answered, and informed consent                            was obtained. Prior Anticoagulants: The patient has                            taken no previous anticoagulant or antiplatelet                            agents. ASA Grade Assessment: II - A patient with                            mild systemic disease. After reviewing the risks                            and benefits, the patient was deemed in                            satisfactory condition to undergo the procedure.  After obtaining informed consent, the colonoscope                            was passed under direct vision. Throughout the                            procedure, the patient's blood pressure, pulse, and                            oxygen saturations were monitored continuously. The                            Colonoscope was introduced through the anus and                            advanced to the  the cecum, identified by                            appendiceal orifice and ileocecal valve. The                            quality of the bowel preparation was adequate. The                            ileocecal valve, appendiceal orifice, and rectum                            were photographed. Scope In: 8:49:54 AM Scope Out: 9:13:09 AM Scope Withdrawal Time: 0 hours 12 minutes 6 seconds  Total Procedure Duration: 0 hours 23 minutes 15 seconds  Findings:                 The perianal and digital rectal examinations were                            normal.                           Three sessile polyps were found in the ascending                            colon. The polyps were 4 to 7 mm in size. These                            polyps were removed with a cold snare. Resection                            and retrieval were complete.                           A 2 mm polyp was found in the transverse colon. The                            polyp was sessile. The polyp was removed with a  cold biopsy forceps. Resection and retrieval were                            complete.                           A few small-mouthed diverticula were found in the                            sigmoid colon.                           Non-bleeding internal hemorrhoids were found during                            retroflexion. The hemorrhoids were medium-sized. Complications:            No immediate complications. Estimated Blood Loss:     Estimated blood loss was minimal. Impression:               - Three 4 to 7 mm polyps in the ascending colon,                            removed with a cold snare. Resected and retrieved.                           - One 2 mm polyp in the transverse colon, removed                            with a cold biopsy forceps. Resected and retrieved.                           - Diverticulosis in the sigmoid colon.                           - Non-bleeding internal  hemorrhoids. Recommendation:           - Patient has a contact number available for                            emergencies. The signs and symptoms of potential                            delayed complications were discussed with the                            patient. Return to normal activities tomorrow.                            Written discharge instructions were provided to the                            patient.                           - Resume previous diet.                           -  Continue present medications.                           - Await pathology results.                           - Repeat colonoscopy in 3 - 5 years for                            surveillance. Mauri Pole, MD 11/06/2018 9:20:51 AM This report has been signed electronically.

## 2018-11-06 NOTE — Progress Notes (Addendum)
Called to room to assist during endoscopic procedure.  Patient ID and intended procedure confirmed with present staff. Received instructions for my participation in the procedure from the performing physician.  

## 2018-11-06 NOTE — Progress Notes (Signed)
A/ox3 pleased with MAC, report to RN 

## 2018-11-06 NOTE — Patient Instructions (Signed)
Polyp  And hemorrhoid surgery info given  YOU HAD AN ENDOSCOPIC PROCEDURE TODAY AT [facility]:   Refer to the procedure report that was given to you for any specific questions about what was found during the examination.  If the procedure report does not answer your questions, please call your gastroenterologist to clarify.  If you requested that your care partner not be given the details of your procedure findings, then the procedure report has been included in a sealed envelope for you to review at your convenience later.  YOU SHOULD EXPECT: Some feelings of bloating in the abdomen. Passage of more gas than usual.  Walking can help get rid of the air that was put into your GI tract during the procedure and reduce the bloating. If you had a lower endoscopy (such as a colonoscopy or flexible sigmoidoscopy) you may notice spotting of blood in your stool or on the toilet paper. If you underwent a bowel prep for your procedure, you may not have a normal bowel movement for a few days.  Please Note:  You might notice some irritation and congestion in your nose or some drainage.  This is from the oxygen used during your procedure.  There is no need for concern and it should clear up in a day or so.  SYMPTOMS TO REPORT IMMEDIATELY:   Following lower endoscopy (colonoscopy or flexible sigmoidoscopy):  Excessive amounts of blood in the stool  Significant tenderness or worsening of abdominal pains  Swelling of the abdomen that is new, acute  Fever of 100F or higher   For urgent or emergent issues, a gastroenterologist can be reached at any hour by calling [phone].   DIET:  We do recommend a small meal at first, but then you may proceed to your regular diet.  Drink plenty of fluids but you should avoid alcoholic beverages for 24 hours.  ACTIVITY:  You should plan to take it easy for the rest of today and you should NOT DRIVE or use heavy machinery until tomorrow (because of the  sedation medicines used during the test).    FOLLOW UP: Our staff will call the number listed on your records the next business day following your procedure to check on you and address any questions or concerns that you may have regarding the information given to you following your procedure. If we do not reach you, we will leave a message.  However, if you are feeling well and you are not experiencing any problems, there is no need to return our call.  We will assume that you have returned to your regular daily activities without incident.  If any biopsies were taken you will be contacted by phone or by letter within the next 1-3 weeks.  Please call us at [phone] if you have not heard about the biopsies in 3 weeks.    SIGNATURES/CONFIDENTIALITY: You and/or your care partner have signed paperwork which will be entered into your electronic medical record.  These signatures attest to the fact that that the information above on your After Visit Summary has been reviewed and is understood.  Full responsibility of the confidentiality of this discharge information lies with you and/or your care-partner.

## 2018-11-06 NOTE — Progress Notes (Signed)
Called to room to assist during endoscopic procedure.  Patient ID and intended procedure confirmed with present staff. Received instructions for my participation in the procedure from the performing physician.  

## 2018-11-07 ENCOUNTER — Telehealth: Payer: Self-pay | Admitting: *Deleted

## 2018-11-07 NOTE — Telephone Encounter (Signed)
  Follow up Call-  Call back number 11/06/2018  Post procedure Call Back phone  # [phone]  Permission to leave phone message Yes  Some recent data might be hidden     Patient questions:  Do you have a fever, pain , or abdominal swelling? No. Pain Score  0 *  Have you tolerated food without any problems? Yes.    Have you been able to return to your normal activities? Yes.    Do you have any questions about your discharge instructions: Diet   No. Medications  No. Follow up visit  No.  Do you have questions or concerns about your Care? No.  Actions: * If pain score is 4 or above: No action needed, pain <4.

## 2018-11-19 ENCOUNTER — Encounter: Payer: Self-pay | Admitting: Gastroenterology

## 2019-05-15 ENCOUNTER — Encounter: Payer: Self-pay | Admitting: Family Medicine

## 2019-05-17 ENCOUNTER — Encounter: Payer: Self-pay | Admitting: Family Medicine

## 2019-05-17 ENCOUNTER — Telehealth: Payer: Self-pay | Admitting: *Deleted

## 2019-05-17 ENCOUNTER — Ambulatory Visit (INDEPENDENT_AMBULATORY_CARE_PROVIDER_SITE_OTHER): Payer: BC Managed Care – PPO | Admitting: Family Medicine

## 2019-05-17 ENCOUNTER — Other Ambulatory Visit: Payer: Self-pay

## 2019-05-17 DIAGNOSIS — J3089 Other allergic rhinitis: Secondary | ICD-10-CM

## 2019-05-17 DIAGNOSIS — J309 Allergic rhinitis, unspecified: Secondary | ICD-10-CM | POA: Insufficient documentation

## 2019-05-17 NOTE — Progress Notes (Signed)
Virtual Visit via Video Note   I connected with [name] on 05/18/19 at  3:00 PM EDT by a video enabled telemedicine application and verified that I am speaking with the correct person using two identifiers.  Location patient: home Location provider:work office Persons participating in the virtual visit: patient, provider  I discussed the limitations of evaluation and management by telemedicine and the availability of in person appointments. The patient expressed understanding and agreed to proceed.   HPI: [name] is a 63 yo male with hx of allergies requesting a letter for employer to allow him to work from home. 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 He is afraid of being in contact with COVID-19 infection. States that he has "bad" allergies, the "only" medication that helps is OTC Nettle Leaf.  Currently asymptomatic.  ROS: See pertinent positives and negatives per HPI.  Past Medical History:  Diagnosis Date  . Adenomatous colon polyp   . Allergy   . Generalized headaches   . GERD (gastroesophageal reflux disease)     Past Surgical History:  Procedure Laterality Date  . COLONOSCOPY    . TONSILLECTOMY    . TONSILLECTOMY     as a child    Family History  Problem Relation Age of Onset  . Heart disease Father   . Early death Father   . Heart attack Father   . Diabetes Father   . Hearing loss Mother   . Dementia Mother   . Diabetes Maternal Grandfather   . Colon cancer Neg Hx   . Esophageal cancer Neg Hx   . Stomach cancer Neg Hx   . Rectal cancer Neg Hx     Social History   Socioeconomic History  . Marital status: Married    Spouse name: Not on file  . Number of children: 1  . Years of education: Not on file  . Highest education level: Not on file  Occupational History  . Occupation: Professor    Fish farm manager: [employer]  Social Needs  . Financial resource strain: Not on file  . Food  insecurity    Worry: Not on file    Inability: Not on file  . Transportation needs    Medical: Not on file    Non-medical: Not on file  Tobacco Use  . Smoking status: Former Smoker    Quit date: 11/07/2007    Years since quitting: 11.5  . Smokeless tobacco: Never Used  Substance and Sexual Activity  . Alcohol use: Yes    Comment: One drink 3 times a week  . Drug use: No  . Sexual activity: Not on file  Lifestyle  . Physical activity    Days per week: Not on file    Minutes per session: Not on file  . Stress: Not on file  Relationships  . Social Herbalist on phone: Not on file    Gets together: Not on file    Attends religious service: Not on file    Active member of club or organization: Not on file    Attends meetings of clubs or organizations: Not on file    Relationship status: Not on file  . Intimate partner violence    Fear of current or ex partner: Not on file    Emotionally abused: Not on file    Physically abused: Not on file    Forced sexual activity: Not  on file  Other Topics Concern  . Not on file  Social History Narrative   Daily caffeine       Current Outpatient Medications:  .  Ascorbic Acid (VITAMIN C) 1000 MG tablet, Take 1,000 mg by mouth as needed., Disp: , Rfl:  .  Garlic 093 MG TABS, Take 200 mg by mouth once a week., Disp: , Rfl:  .  Nettle, Urtica Dioica, (NETTLE LEAF PO), Take 900 mg by mouth as needed., Disp: , Rfl:   EXAM:  VITALS per patient if applicable:N/A  GENERAL: alert, oriented, appears well and in no acute distress  HEENT: atraumatic, conjunttiva clear, no obvious abnormalities on inspection of external nose and ears  LUNGS: on inspection no signs of respiratory distress, breathing rate appears normal, no obvious gross SOB, gasping or wheezing  CV: no obvious cyanosis  PSYCH/NEURO: pleasant and cooperative, no obvious depression, he is anxious.Speech and thought processing grossly intact  ASSESSMENT AND  PLAN:  Discussed the following assessment and plan:  Non-seasonal allergic rhinitis, unspecified trigger  Educated about signs and symptoms of COVID 19, comorbidity that increase risk for complications, and management.   10 min face to face OV. > 50% was dedicated to education of COVID-19 symptoms and measures to take for prevention.  Letter will be provided but it is up to his employer to decide if working for home is an option.  I discussed the assessment and treatment plan with the patient. He was provided an opportunity to ask questions and all were answered. The patient agreed with the plan and demonstrated an understanding of the instructions.    Return if symptoms worsen or fail to improve.    Makinsley Schiavi Martinique, MD

## 2019-05-17 NOTE — Telephone Encounter (Signed)
Copied from [place] [id number]. Topic: Appointment Scheduling - Scheduling Inquiry for Clinic >> May 17, 2019  1:20 PM Selinda Flavin B, NT wrote: Reason for CRM: Patient calling and states that he has not received the link for his virtual appointment for today at 3:00pm yet. States that he spoke with [facility] and was advised that he should've received the link this morning. Please advise.  Patient notfied he will get link closer to appointment time. Patient verbalized understanding

## 2019-05-21 ENCOUNTER — Encounter: Payer: Self-pay | Admitting: Family Medicine

## 2019-11-26 ENCOUNTER — Encounter: Payer: Self-pay | Admitting: Family Medicine

## 2022-02-01 ENCOUNTER — Encounter: Payer: Self-pay | Admitting: Gastroenterology

## 2022-03-01 NOTE — Progress Notes (Signed)
? ?HPI: ?[name] is a 66 y.o.male here today for his routine physical examination. He also has some concerns he would like to discuss today. ? ?Last CPE: 09/25/2018 ? ?Regular exercise: Swimming 2 times per week, sometimes walks a few times per week and depending of weather. ?Following a healthful diet: Most of the time, no fast food. Some vegetables, fruits, salads. ? ?Chronic medical problems: Allergic rhinitis and former smoker among some. ? ?Immunization History  ?Administered Date(s) Administered  ? Pneumococcal Polysaccharide-23 03/02/2022  ? ?Health Maintenance  ?Topic Date Due  ? TETANUS/TDAP  Never done  ? Zoster Vaccines- Shingrix (1 of 2) Never done  ? COLONOSCOPY (Pts 45-9yr Insurance coverage will need to be confirmed)  11/06/2021  ? COVID-19 Vaccine (1) 03/18/2022 (Originally 09/10/1956)  ? INFLUENZA VACCINE  07/05/2022  ? Pneumonia Vaccine 67 Years old (2 - PCV) 03/03/2023  ? Hepatitis C Screening  Completed  ? HPV VACCINES  Aged Out  ? ?Last prostate ca screening: He is concerned because some of his friends have been Dx'ed with prostate cancer, so would like PSA check. ?Last PSA in 09/2018 was normal at 2.5. ?Nocturia x 1-2, stable for years. ?Negative for changes in urine stream or signs of obstruction. ? ?-Negative for high alcohol intake, he drinks beer with meals sometimes and wine. ? ?-Concerns and/or follow up today:  ?He was last seen on 05/17/2019. ? ?Over a year of leg problems at night. LLE cramp and pain, tingling. ?Usually at night when in bed but lately he has had symptoms during the day. ?He cannot describe type of pain. ?Cramps and pain is on posterior aspect of thigh. ?Pain interferes with sleep. ?Negative for calf pain with walking or weakness. ?He has not noted back pain. ? ?He would like a CXR done today. ? ?Occasional cough. ?Negative for abnormal wt loss or night sweats. ? ?Concerned about lung cancer due to his hx of tobacco use, quit over 15 years ago. ?Also  concerned because he gets "sick" when he smells tobacco.  ? ?On examination today noted mild erythema and scaly areas around ears. He has had problem for years, mildly pruritic.He always thought is was dry soap left on areas after showering.. ? ?Review of Systems  ?Constitutional:  Positive for fatigue. Negative for activity change, appetite change and fever.  ?HENT:  Negative for mouth sores, nosebleeds, sore throat and trouble swallowing.   ?Eyes:  Negative for redness and visual disturbance.  ?Respiratory:  Negative for cough, shortness of breath and wheezing.   ?Cardiovascular:  Negative for chest pain, palpitations and leg swelling.  ?Gastrointestinal:  Negative for abdominal pain, blood in stool, nausea and vomiting.  ?Endocrine: Negative for cold intolerance, heat intolerance, polydipsia, polyphagia and polyuria.  ?Genitourinary:  Negative for decreased urine volume, dysuria, genital sores, hematuria and testicular pain.  ?Musculoskeletal:  Negative for gait problem and myalgias.  ?Skin:  Negative for color change and wound.  ?Allergic/Immunologic: Positive for environmental allergies.  ?Neurological:  Negative for syncope, weakness and headaches.  ?Hematological:  Negative for adenopathy. Does not bruise/bleed easily.  ?Psychiatric/Behavioral:  Positive for sleep disturbance. Negative for confusion. The patient is not nervous/anxious.   ? ?Current Outpatient Medications on File Prior to Visit  ?Medication Sig Dispense Refill  ? Ascorbic Acid (VITAMIN C) 1000 MG tablet Take 1,000 mg by mouth as needed.    ? Garlic 2563MG TABS Take 200 mg by mouth once a week.    ? Nettle, Urtica Dioica, (  NETTLE LEAF PO) Take 900 mg by mouth as needed.    ? ?No current facility-administered medications on file prior to visit.  ? ?Past Medical History:  ?Diagnosis Date  ? Adenomatous colon polyp   ? Allergy   ? Generalized headaches   ? GERD (gastroesophageal reflux disease)   ? ?Past Surgical History:  ?Procedure Laterality  Date  ? COLONOSCOPY    ? TONSILLECTOMY    ? TONSILLECTOMY    ? as a child  ? ? ?Allergies  ?Allergen Reactions  ? Alka-Seltzer Plus Sinus [Phenylephrine-Aspirin] Hives and Itching  ?  Plain alka-seltzer.  ? Shellfish Allergy Anaphylaxis  ?  Shrimp.  ? ? ?Family History  ?Problem Relation Age of Onset  ? Heart disease Father   ? Early death Father   ? Heart attack Father   ? Diabetes Father   ? Hearing loss Mother   ? Dementia Mother   ? Diabetes Maternal Grandfather   ? Colon cancer Neg Hx   ? Esophageal cancer Neg Hx   ? Stomach cancer Neg Hx   ? Rectal cancer Neg Hx   ? ?Social History  ? ?Socioeconomic History  ? Marital status: Married  ?  Spouse name: Not on file  ? Number of children: 1  ? Years of education: Not on file  ? Highest education level: Not on file  ?Occupational History  ? Occupation: Professor  ?  Employer: [employer]  ?Tobacco Use  ? Smoking status: Former  ?  Types: Cigarettes  ?  Quit date: 11/07/2007  ?  Years since quitting: 14.3  ? Smokeless tobacco: Never  ?Vaping Use  ? Vaping Use: Never used  ?Substance and Sexual Activity  ? Alcohol use: Yes  ?  Comment: One drink 3 times a week  ? Drug use: No  ? Sexual activity: Not on file  ?Other Topics Concern  ? Not on file  ?Social History Narrative  ? Daily caffeine   ? ?Social Determinants of Health  ? ?Financial Resource Strain: Not on file  ?Food Insecurity: Not on file  ?Transportation Needs: Not on file  ?Physical Activity: Not on file  ?Stress: Not on file  ?Social Connections: Not on file  ? ?Vitals:  ? 03/02/22 0856  ?BP: 128/80  ?Pulse: 80  ?Resp: 16  ?SpO2: 94%  ? ?Body mass index is 23.14 kg/m?. ? ?Wt Readings from Last 3 Encounters:  ?03/02/22 160 lb 2 oz (72.6 kg)  ?11/06/18 158 lb (71.7 kg)  ?10/23/18 159 lb 9.6 oz (72.4 kg)  ? ?Physical Exam ?Vitals and nursing note reviewed.  ?Constitutional:   ?   General: He is not in acute distress. ?   Appearance: He is well-developed.  ?HENT:  ?   Head: Normocephalic and atraumatic.  ?   Right  Ear: Tympanic membrane, ear canal and external ear normal.  ?   Left Ear: Tympanic membrane, ear canal and external ear normal.  ?   Mouth/Throat:  ?   Mouth: Mucous membranes are moist.  ?   Pharynx: Oropharynx is clear.  ?Eyes:  ?   Extraocular Movements: Extraocular movements intact.  ?   Conjunctiva/sclera: Conjunctivae normal.  ?   Pupils: Pupils are equal, round, and reactive to light.  ?Neck:  ?   Thyroid: No thyromegaly.  ?   Trachea: No tracheal deviation.  ?Cardiovascular:  ?   Rate and Rhythm: Normal rate and regular rhythm.  ?   Pulses:     ?  Dorsalis pedis pulses are 2+ on the right side and 2+ on the left side.  ?   Heart sounds: No murmur heard. ?Pulmonary:  ?   Effort: Pulmonary effort is normal. No respiratory distress.  ?   Breath sounds: Normal breath sounds.  ?Abdominal:  ?   Palpations: Abdomen is soft. There is no hepatomegaly or mass.  ?   Tenderness: There is no abdominal tenderness.  ?Genitourinary: ?   Comments: No concerns. ?Musculoskeletal:     ?   General: No tenderness.  ?   Cervical back: Normal range of motion.  ?   Comments: No major deformities appreciated and no signs of synovitis.  ?Lymphadenopathy:  ?   Cervical: No cervical adenopathy.  ?   Upper Body:  ?   Right upper body: No supraclavicular adenopathy.  ?   Left upper body: No supraclavicular adenopathy.  ?Skin: ?   General: Skin is warm.  ?   Findings: Rash present. No erythema. Rash is scaling.  ?   Comments: External ears with mild erythema and scaly areas around antihelix and concha.  ?Neurological:  ?   General: No focal deficit present.  ?   Mental Status: He is alert and oriented to person, place, and time.  ?   Cranial Nerves: No cranial nerve deficit.  ?   Sensory: No sensory deficit.  ?   Gait: Gait normal.  ?   Deep Tendon Reflexes:  ?   Reflex Scores: ?     Bicep reflexes are 2+ on the right side and 2+ on the left side. ?     Patellar reflexes are 2+ on the right side and 2+ on the left side. ?Psychiatric:      ?   Mood and Affect: Mood is anxious.  ? ?ASSESSMENT AND PLAN: ? ?Mr.Rodney was seen today for annual exam and leg pain. ? ?Diagnoses and all orders for this visit: ?Orders Placed This Encounter  ?Proce

## 2022-03-02 ENCOUNTER — Ambulatory Visit (INDEPENDENT_AMBULATORY_CARE_PROVIDER_SITE_OTHER): Payer: BC Managed Care – PPO

## 2022-03-02 ENCOUNTER — Ambulatory Visit (INDEPENDENT_AMBULATORY_CARE_PROVIDER_SITE_OTHER): Payer: BC Managed Care – PPO | Admitting: Family Medicine

## 2022-03-02 ENCOUNTER — Encounter: Payer: Self-pay | Admitting: Family Medicine

## 2022-03-02 ENCOUNTER — Other Ambulatory Visit: Payer: Self-pay

## 2022-03-02 VITALS — BP 128/80 | HR 80 | Resp 16 | Ht 69.75 in | Wt 160.1 lb

## 2022-03-02 DIAGNOSIS — Z130 Encounter for screening for diseases of the blood and blood-forming organs and certain disorders involving the immune mechanism: Secondary | ICD-10-CM | POA: Diagnosis not present

## 2022-03-02 DIAGNOSIS — L219 Seborrheic dermatitis, unspecified: Secondary | ICD-10-CM

## 2022-03-02 DIAGNOSIS — Z0000 Encounter for general adult medical examination without abnormal findings: Secondary | ICD-10-CM | POA: Diagnosis not present

## 2022-03-02 DIAGNOSIS — Z1329 Encounter for screening for other suspected endocrine disorder: Secondary | ICD-10-CM

## 2022-03-02 DIAGNOSIS — Z13228 Encounter for screening for other metabolic disorders: Secondary | ICD-10-CM

## 2022-03-02 DIAGNOSIS — R059 Cough, unspecified: Secondary | ICD-10-CM | POA: Diagnosis not present

## 2022-03-02 DIAGNOSIS — Z23 Encounter for immunization: Secondary | ICD-10-CM | POA: Diagnosis not present

## 2022-03-02 DIAGNOSIS — Z125 Encounter for screening for malignant neoplasm of prostate: Secondary | ICD-10-CM

## 2022-03-02 DIAGNOSIS — R202 Paresthesia of skin: Secondary | ICD-10-CM

## 2022-03-02 DIAGNOSIS — R200 Anesthesia of skin: Secondary | ICD-10-CM

## 2022-03-02 DIAGNOSIS — Z136 Encounter for screening for cardiovascular disorders: Secondary | ICD-10-CM

## 2022-03-02 DIAGNOSIS — Z13220 Encounter for screening for lipoid disorders: Secondary | ICD-10-CM

## 2022-03-02 LAB — TSH: TSH: 1.13 u[IU]/mL (ref 0.35–5.50)

## 2022-03-02 LAB — COMPREHENSIVE METABOLIC PANEL
ALT: 18 U/L (ref 0–53)
AST: 18 U/L (ref 0–37)
Albumin: 4.5 g/dL (ref 3.5–5.2)
Alkaline Phosphatase: 76 U/L (ref 39–117)
BUN: 14 mg/dL (ref 6–23)
CO2: 25 mEq/L (ref 19–32)
Calcium: 9.3 mg/dL (ref 8.4–10.5)
Chloride: 105 mEq/L (ref 96–112)
Creatinine, Ser: 1 mg/dL (ref 0.40–1.50)
GFR: 78.17 mL/min (ref >60.00)
Glucose, Bld: 97 mg/dL (ref 70–99)
Potassium: 4.3 mEq/L (ref 3.5–5.1)
Sodium: 141 mEq/L (ref 135–145)
Total Bilirubin: 1 mg/dL (ref 0.2–1.2)
Total Protein: 6.8 g/dL (ref 6.0–8.3)

## 2022-03-02 LAB — LIPID PANEL
Cholesterol: 212 mg/dL — ABNORMAL HIGH (ref 0–200)
HDL: 68.3 mg/dL (ref >39.00)
LDL Cholesterol: 125 mg/dL — ABNORMAL HIGH (ref 0–99)
NonHDL: 143.72
Total CHOL/HDL Ratio: 3
Triglycerides: 93 mg/dL (ref 0.0–149.0)
VLDL: 18.6 mg/dL (ref 0.0–40.0)

## 2022-03-02 LAB — CBC
HCT: 47.5 % (ref 39.0–52.0)
Hemoglobin: 16.1 g/dL (ref 13.0–17.0)
MCHC: 33.9 g/dL (ref 30.0–36.0)
MCV: 91.7 fl (ref 78.0–100.0)
Platelets: 205 10*3/uL (ref 150.0–400.0)
RBC: 5.18 Mil/uL (ref 4.22–5.81)
RDW: 14 % (ref 11.5–15.5)
WBC: 5.3 10*3/uL (ref 4.0–10.5)

## 2022-03-02 LAB — PSA: PSA: 1.87 ng/mL (ref 0.10–4.00)

## 2022-03-02 LAB — HEMOGLOBIN A1C: Hgb A1c MFr Bld: 5.6 % (ref 4.6–6.5)

## 2022-03-02 LAB — VITAMIN B12: Vitamin B-12: 193 pg/mL — ABNORMAL LOW (ref 211–911)

## 2022-03-02 MED ORDER — CLOTRIMAZOLE-BETAMETHASONE 1-0.05 % EX CREA: 1.0000 "application " | TOPICAL_CREAM | Freq: Every day | CUTANEOUS | 1 refills | Status: DC | PRN

## 2022-03-02 MED ORDER — GABAPENTIN 100 MG PO CAPS: 100.0000 mg | ORAL_CAPSULE | Freq: Every day | ORAL | 1 refills | Status: DC

## 2022-03-02 NOTE — Patient Instructions (Addendum)
A few things to remember from today's visit: ? ? ?Routine general medical examination at a health care facility ? ?Screening for endocrine, metabolic, and immunity disorder - Plan: Hemoglobin A1c, Comprehensive metabolic panel, Comprehensive metabolic panel, Hemoglobin A1c ? ?Screening for lipoid disorders - Plan: Lipid panel, Lipid panel ? ?Prostate cancer screening - Plan: PSA, PSA ? ?Cough, unspecified type - Plan: DG Chest 2 View ? ?Screening for AAA (aortic abdominal aneurysm) - Plan: US Aorta ? ?Numbness and tingling of left lower extremity - Plan: MR Lumbar Spine Wo Contrast, CBC, Vitamin B12, TSH, gabapentin (NEURONTIN) 100 MG capsule, TSH, Vitamin B12, CBC ? ?Need for 23-polyvalent pneumococcal polysaccharide vaccine - Plan: Pneumococcal polysaccharide vaccine 23-valent greater than or equal to 2yo subcutaneous/IM ? ?Seborrheic dermatitis - Plan: clotrimazole-betamethasone (LOTRISONE) cream ? ?Do not use My Chart to request refills or for acute issues that need immediate attention. ?  ?Gabapentin a la hora de irse a la cama. ? ?Cuidados preventivos en los hombres a partir de los 21 a?os de edad ?Preventive Care 12 Years and Older, Male ?Los cuidados preventivos hacen referencia a las opciones en cuanto al estilo de vida y a las visitas al m?dico, las cuales pueden promover la salud y Musician. Las visitas de cuidado preventivo tambi?n se denominan ex?menes de Therapist, sports. ??Qu? puedo esperar para mi visita de cuidado preventivo? ?Asesoramiento ?Durante la visita de cuidado preventivo, el m?dico puede preguntarle sobre lo siguiente: ?Antecedentes m?dicos, incluidos los siguientes: ?Problemas m?dicos pasados. ?Antecedentes m?dicos familiares. ?Sus antecedentes de ca?das. ?Salud actual, incluido lo siguiente: ?Su bienestar emocional. ?Bienestar en el hogar y las relaciones personales. ?Su actividad sexual. ?Memoria y capacidad de comprensi?n (capacidad intelectual). ?Estilo de vida, incluido lo  siguiente: ?Consumo de alcohol, nicotina, tabaco o drogas. ?Acceso a armas de fuego. ?H?bitos de alimentaci?n, ejercicio y sue?o. ?Su trabajo y [place] laboral. ?Uso de pantalla solar. ?Cuestiones de seguridad, como el uso de cintur?n de seguridad y casco de Secondary school teacher. ?Examen f?sico ?El m?dico revisar? lo siguiente: ?Estatura y [place]. Estos pueden usarse para calcular el IMC (?ndice de masa corporal). El [facility] es una medici?n que indica si tiene un peso saludable. ?Circunferencia de la cintura. Es una medici?n alrededor de Science writer. Esta medici?n tambi?n indica si tiene un peso saludable y puede ayudar a predecir su riesgo de padecer ciertas enfermedades, como diabetes tipo 2 y presi?n arterial alta. ?Frecuencia card?aca y presi?n arterial. ?Temperatura corporal. ?Piel para Recruitment consultant. ??Qu? vacunas necesito? ?Las vacunas se aplican a varias edades, seg?n un cronograma. El m?dico le recomendar? vacunas seg?n su edad, sus antecedentes m?dicos, su estilo de vida y otros factores, como los viajes o el lugar donde trabaja. ??Qu? pruebas necesito? ?Pruebas de detecci?n ?El m?dico puede recomendar pruebas de detecci?n de ciertas afecciones. Esto puede incluir: ?Niveles de l?pidos y colesterol. ?Pruebas de detecci?n de la diabetes. Esto se realiza mediante un control del az?car en la sangre (glucosa) despu?s de no haber comido durante un periodo de tiempo (ayuno). ?Prueba de hepatitis C. ?Prueba de hepatitis B. ?Prueba del VIH (virus de inmunodeficiencia humana). ?Pruebas de infecciones de transmisi?n sexual (ITS), si est? en riesgo. ?Pruebas de detecci?n de c?ncer de pulm?n. ?Pruebas de detecci?n de c?ncer colorrectal. ?Examen de detecci?n del c?ncer de pr?stata. ?Estudio de detecci?n de aneurisma a?rtico abdominal (AAA). Es posible que necesite este estudio si es fumador o lo fue en el pasado. ?Hable con su m?dico [employer], las opciones de tratamiento y, si corresponde, Engineer, materials  necesidad de Optometrist m?s pruebas. ?Siga estas instrucciones en su casa: ?Comida y bebida ? ?Siga una dieta que incluya frutas y verduras frescas, cereales integrales, prote?nas magras y productos l?cteos descremados. Limite el consumo de alimentos con alto contenido de az?car, grasas saturadas y sal. ?Tome los suplementos vitam?nicos y minerales como se lo haya indicado el m?dico. ?No beba alcohol si el m?dico se lo proh?be. ?Si bebe alcohol: ?Limite la cantidad que consume de 0 a 2 medidas por d?a. ?Sepa cu?nta cantidad de alcohol hay en las bebidas que toma. En los [place], una medida equivale a una botella de cerveza de 12 oz (355 ml), un vaso de vino de 5 oz (148 ml) o un vaso de una bebida alcoh?lica de alta graduaci?n de 1? oz (44 ml). ?[place] ?Cep?llese los dientes a la ma?ana y a la noche con pasta dental con fluoruro. Use hilo dental una vez al d?a. ?Haga al menos 30 minutos de ejercicio, 5 o m?s d?as cada semana. ?No consuma ning?n producto que contenga nicotina o tabaco. Estos productos incluyen cigarrillos, tabaco para mascar y aparatos de vapeo, como los cigarrillos electr?nicos. Si necesita ayuda para dejar de fumar, consulte al m?dico. ?No consuma drogas. ?Si es sexualmente activo, practique sexo seguro. Use un cond?n u otra forma de protecci?n para prevenir las infecciones de transmisi?n sexual (ITS). ?Tome aspirina ?nicamente como se lo haya indicado el m?dico. Aseg?rese de que comprende qu? cantidad y cu?l presentaci?n debe tomar. Trabaje con el m?dico para averiguar si es seguro y beneficioso para usted tomar aspirina a diario. ?Preg?ntele al m?dico si necesita tomar un medicamento para reducir el colesterol (estatina). ?Busque maneras saludables de controlar el estr?s, tales como: ?Meditaci?n, yoga o escuchar m?sica. ?Lleve un diario personal. ?Hable con una persona confiable. ?Pase tiempo con amigos y familiares. ?Seguridad ?[place] siempre el cintur?n de seguridad al conducir o  viajar en un veh?culo. ?No conduzca: ?Si ha estado bebiendo alcohol. No viaje con un conductor que ha estado bebiendo. ?Si est? cansado o distra?do. ?Mientras est? enviando mensajes de texto. ?Si ha estado usando sustancias o drogas que alteran la funci?n mental. ?Use un casco y otros equipos de protecci?n durante las actividades deportivas. ?Si tiene armas de fuego en su casa, aseg?rese de seguir todos los procedimientos de seguridad correspondientes. ?Minimice la exposici?n a la radiaci?n UV para reducir el riesgo de c?ncer de piel. ??Cu?ndo volver? ?Visite al m?dico una vez al a?o para una visita anual de control de bienestar. ?Preg?ntele al m?dico con qu? frecuencia debe realizarse un control de la vista y los dientes. ?Mantenga su esquema de vacunaci?n al d?a. ?Esta informaci?n no tiene Marine scientist el consejo del m?dico. Aseg?rese de hacerle al m?dico cualquier pregunta que tenga. ?Document Revised: 06/09/2021 Document Reviewed: 06/09/2021 ?Elsevier Patient Education ? 2022 [place]. ? ? ? ? ? ? ?

## 2022-03-03 ENCOUNTER — Telehealth (HOSPITAL_BASED_OUTPATIENT_CLINIC_OR_DEPARTMENT_OTHER): Payer: Self-pay

## 2022-03-04 ENCOUNTER — Ambulatory Visit
Admission: RE | Admit: 2022-03-04 | Discharge: 2022-03-04 | Disposition: A | Discharge status: Home / Self Care | Payer: BC Managed Care – PPO | Source: Ambulatory Visit | Attending: Family Medicine | Admitting: Family Medicine

## 2022-03-04 DIAGNOSIS — Z136 Encounter for screening for cardiovascular disorders: Secondary | ICD-10-CM

## 2022-03-05 ENCOUNTER — Encounter: Payer: Self-pay | Admitting: Family Medicine

## 2022-03-09 ENCOUNTER — Ambulatory Visit (HOSPITAL_BASED_OUTPATIENT_CLINIC_OR_DEPARTMENT_OTHER)
Admission: RE | Admit: 2022-03-09 | Discharge: 2022-03-09 | Disposition: A | Discharge status: Home / Self Care | Payer: BC Managed Care – PPO | Source: Ambulatory Visit | Attending: Family Medicine | Admitting: Family Medicine

## 2022-03-09 DIAGNOSIS — R200 Anesthesia of skin: Secondary | ICD-10-CM | POA: Insufficient documentation

## 2022-03-09 DIAGNOSIS — R202 Paresthesia of skin: Secondary | ICD-10-CM | POA: Insufficient documentation

## 2022-03-10 ENCOUNTER — Encounter: Payer: Self-pay | Admitting: Family Medicine

## 2022-03-10 NOTE — Telephone Encounter (Signed)
Per result note: B12 is low. Recommend B12 1000 mcg IM weekly x 2, then monthly x 4, then continue with sublingual B12 1000 mcg daily ?

## 2022-03-15 ENCOUNTER — Ambulatory Visit (INDEPENDENT_AMBULATORY_CARE_PROVIDER_SITE_OTHER): Payer: BC Managed Care – PPO | Admitting: *Deleted

## 2022-03-15 DIAGNOSIS — E538 Deficiency of other specified B group vitamins: Secondary | ICD-10-CM | POA: Diagnosis not present

## 2022-03-15 MED ORDER — CYANOCOBALAMIN 1000 MCG/ML IJ SOLN
1000.0000 ug | Freq: Once | INTRAMUSCULAR | Status: AC
  Administered 2022-03-15: 1000 ug via INTRAMUSCULAR

## 2022-03-15 NOTE — Progress Notes (Signed)
Per orders of Dr. Jordan, injection of Cyanocobalamin 1000mcg given by Kandas Oliveto A. Patient tolerated injection well.  

## 2022-03-23 ENCOUNTER — Ambulatory Visit (INDEPENDENT_AMBULATORY_CARE_PROVIDER_SITE_OTHER): Payer: BC Managed Care – PPO

## 2022-03-23 DIAGNOSIS — E538 Deficiency of other specified B group vitamins: Secondary | ICD-10-CM

## 2022-03-23 MED ORDER — CYANOCOBALAMIN 1000 MCG/ML IJ SOLN
1000.0000 ug | Freq: Once | INTRAMUSCULAR | Status: AC
  Administered 2022-03-23: 1000 ug via INTRAMUSCULAR

## 2022-03-23 NOTE — Progress Notes (Signed)
Pt here for weekly B12 injection #2 of 2 per Dr. Martinique. ? ?B12 101mg given IM left deltoid and pt tolerated injection well. ? ?Next B12 injection scheduled for 04/22/22. ? ?

## 2022-04-22 ENCOUNTER — Ambulatory Visit (INDEPENDENT_AMBULATORY_CARE_PROVIDER_SITE_OTHER): Payer: BC Managed Care – PPO

## 2022-04-22 DIAGNOSIS — E538 Deficiency of other specified B group vitamins: Secondary | ICD-10-CM | POA: Diagnosis not present

## 2022-04-22 MED ORDER — CYANOCOBALAMIN 1000 MCG/ML IJ SOLN
1000.0000 ug | Freq: Once | INTRAMUSCULAR | Status: AC
  Administered 2022-04-22: 1000 ug via INTRAMUSCULAR

## 2022-04-22 NOTE — Progress Notes (Signed)
Per orders of Martinique, Betty G, MD, injection of B12 given in   RIGHTdeltoid by Dinari Stgermaine D Pecola Haxton. Patient tolerated injection well.  Lab Results  Component Value Date   VITAMINB12 193 (L) 03/02/2022

## 2022-05-23 ENCOUNTER — Ambulatory Visit (INDEPENDENT_AMBULATORY_CARE_PROVIDER_SITE_OTHER): Payer: BC Managed Care – PPO

## 2022-05-23 DIAGNOSIS — E538 Deficiency of other specified B group vitamins: Secondary | ICD-10-CM

## 2022-05-23 MED ORDER — CYANOCOBALAMIN 1000 MCG/ML IJ SOLN
1000.0000 ug | Freq: Once | INTRAMUSCULAR | Status: AC
  Administered 2022-05-23: 1000 ug via INTRAMUSCULAR

## 2022-05-23 NOTE — Progress Notes (Signed)
Per orders of Dr. Martinique, injection of Cyanocobalamin 3m given by KEncarnacion Slates Patient tolerated injection well.

## 2022-06-20 ENCOUNTER — Ambulatory Visit (INDEPENDENT_AMBULATORY_CARE_PROVIDER_SITE_OTHER): Payer: BC Managed Care – PPO | Admitting: *Deleted

## 2022-06-20 DIAGNOSIS — E538 Deficiency of other specified B group vitamins: Secondary | ICD-10-CM | POA: Diagnosis not present

## 2022-06-20 MED ORDER — CYANOCOBALAMIN 1000 MCG/ML IJ SOLN
1000.0000 ug | Freq: Once | INTRAMUSCULAR | Status: AC
  Administered 2022-06-20: 1000 ug via INTRAMUSCULAR

## 2022-06-20 NOTE — Progress Notes (Signed)
Per orders of Dr. Jordan, injection of Cyanocobalamin 1000mcg given by Sarie Stall A. Patient tolerated injection well.  

## 2022-07-20 ENCOUNTER — Ambulatory Visit (INDEPENDENT_AMBULATORY_CARE_PROVIDER_SITE_OTHER): Payer: BC Managed Care – PPO

## 2022-07-20 ENCOUNTER — Telehealth: Payer: Self-pay

## 2022-07-20 DIAGNOSIS — E538 Deficiency of other specified B group vitamins: Secondary | ICD-10-CM | POA: Diagnosis not present

## 2022-07-20 MED ORDER — CYANOCOBALAMIN 1000 MCG/ML IJ SOLN
1000.0000 ug | Freq: Once | INTRAMUSCULAR | Status: AC
  Administered 2022-07-20: 1000 ug via INTRAMUSCULAR

## 2022-07-20 NOTE — Telephone Encounter (Signed)
Patient has finished his B12 injections and will start the oral OTC b12 1000 mcg daily. He would like to know when you would like to re-check his B12 level? Last check was in March.

## 2022-07-20 NOTE — Progress Notes (Signed)
Pt here for monthly B12 injection per Dr. Volanda Napoleon.  B12 1070mg given IM, and pt tolerated injection well.  Next B12 injection scheduled for not needed. Patient is transitioning to OTC B12 1000 mcg daily.

## 2022-07-26 NOTE — Telephone Encounter (Signed)
Yes, B12 can be ordered. Thanks, BJ

## 2022-07-27 NOTE — Addendum Note (Signed)
Addended by: Rodrigo Ran on: 07/27/2022 08:44 AM   Modules accepted: Orders

## 2022-07-27 NOTE — Telephone Encounter (Signed)
Patient just needs a lab appointment, please! Thank you!

## 2022-07-28 ENCOUNTER — Encounter: Payer: Self-pay | Admitting: Family Medicine

## 2022-08-01 ENCOUNTER — Other Ambulatory Visit (INDEPENDENT_AMBULATORY_CARE_PROVIDER_SITE_OTHER): Payer: BC Managed Care – PPO

## 2022-08-01 DIAGNOSIS — E538 Deficiency of other specified B group vitamins: Secondary | ICD-10-CM

## 2022-08-01 LAB — VITAMIN B12: Vitamin B-12: 912 pg/mL — ABNORMAL HIGH (ref 211–911)

## 2022-12-12 ENCOUNTER — Encounter: Payer: Self-pay | Admitting: Family Medicine

## 2023-03-21 ENCOUNTER — Other Ambulatory Visit: Payer: Self-pay

## 2023-04-19 ENCOUNTER — Encounter: Payer: Self-pay | Admitting: Family Medicine

## 2023-04-19 DIAGNOSIS — Z1211 Encounter for screening for malignant neoplasm of colon: Secondary | ICD-10-CM

## 2023-04-26 ENCOUNTER — Encounter: Payer: BC Managed Care – PPO | Admitting: Family Medicine

## 2023-05-02 NOTE — Progress Notes (Signed)
HPI: [name] is a 67 y.o.male with PMHx significant for seasonal allergies and HLD here today for his routine physical examination and follow up.  Last CPE: 03/02/22 He follows a healthful diet, vegetables daily.Decreased meals from 3 to 2 per day. He is walking daily, swims for 45 min 2-3 times per week, and is active with chores around his house. No tobacco use and drinks about  three to four beers in a week and a mixed drink on weekends. Sleeps 7 hours in average.  Immunization History  Administered Date(s) Administered   Pneumococcal Polysaccharide-23 03/02/2022    Health Maintenance  Topic Date Due   DTaP/Tdap/Td (1 - Tdap) Never done   Zoster Vaccines- Shingrix (1 of 2) Never done   Colonoscopy  11/06/2021   Pneumonia Vaccine 59+ Years old (2 of 2 - PCV) 03/03/2023   COVID-19 Vaccine (1) 05/18/2023 (Originally 09/10/1956)   INFLUENZA VACCINE  07/06/2023   Hepatitis C Screening  Completed   HPV VACCINES  Aged Out   Last prostate ca screening:  Nocturia x 1, around 3 am, stable for years.  Lab Results  Component Value Date   PSA 1.87 03/02/2022   PSA 2.59 09/25/2018   PSA 1.68 08/13/2013   HLD: Last visit Rosuvastatin 10 mg was recommended but it was not started.  Lab Results  Component Value Date   CHOL 212 (H) 03/02/2022   HDL 68.30 03/02/2022   LDLCALC 125 (H) 03/02/2022   TRIG 93.0 03/02/2022   CHOLHDL 3 03/02/2022   He reports a recent change in bowel habits, reports nocturnal awakenings for bowel movements, which began around August or September of the previous year. To address this issue, he has adjusted his meal times to dine earlier at 7 PM and incorporated a post-dinner walk, which he finds beneficial for his digestion. He notes a significant improvement, with issues occurring only three times in the last three months. Last colonoscopy in 11/2018, 3-5 years f/u was recommended. He already received a call from GI, he has not scheduled  appt.  Additionally, he reports that LE discomfort, which was addressed in 02/2022, LLE cramps and tingling sensation greatly improved with B12 injections. Currently on B12 oral supplementation. Negative for saddle anesthesia or changes in bowel/bladder function.  Furthermore, he discusses a persistent, sporadic pain, right preauricular. This pain has been occurring for at least a year and happens roughly once every three weeks, lasting only minutes each time.He has not noted earache, changes in hearing,or ear drainage. He has not identified exacerbating or alleviating factors.  C/O small "bump" on scalp, which he has had for years, slow growth and causing no significant symptoms.  Crusty feeling on external left ear. No significant pruritus.  He also inquires about the need to see dermatologist for "skin check." He has not noted suspicious lesions or changes in moles pigmentation.  He is also interested in having DEXA done, wonders if it is covered.  Review of Systems  Constitutional:  Negative for activity change, appetite change, fatigue and fever.  HENT:  Negative for nosebleeds, sore throat and trouble swallowing.   Eyes:  Negative for redness and visual disturbance.  Respiratory:  Negative for cough, shortness of breath and wheezing.   Cardiovascular:  Negative for chest pain, palpitations and leg swelling.  Gastrointestinal:  Negative for abdominal pain, blood in stool, nausea and vomiting.  Endocrine: Negative for cold intolerance, heat intolerance, polydipsia, polyphagia and polyuria.  Genitourinary:  Negative for decreased urine volume,  dysuria, genital sores, hematuria and testicular pain.  Musculoskeletal:  Positive for arthralgias. Negative for gait problem.  Skin:  Negative for color change and rash.  Allergic/Immunologic: Positive for environmental allergies.  Neurological:  Negative for syncope, weakness and headaches.  Hematological:  Negative for adenopathy. Does not  bruise/bleed easily.  Psychiatric/Behavioral:  Negative for behavioral problems and confusion.   All other systems reviewed and are negative.  Current Outpatient Medications on File Prior to Visit  Medication Sig Dispense Refill   Ascorbic Acid (VITAMIN C) 1000 MG tablet Take 1,000 mg by mouth as needed.     clotrimazole-betamethasone (LOTRISONE) cream Apply 1 application. topically daily as needed. Around ears, small amount. 30 g 1   gabapentin (NEURONTIN) 100 MG capsule Take 1 capsule (100 mg total) by mouth at bedtime. 30 capsule 1   Garlic 200 MG TABS Take 200 mg by mouth once a week.     Nettle, Urtica Dioica, (NETTLE LEAF PO) Take 900 mg by mouth as needed.     No current facility-administered medications on file prior to visit.   Past Medical History:  Diagnosis Date   Adenomatous colon polyp    Allergy    Generalized headaches    GERD (gastroesophageal reflux disease)    Past Surgical History:  Procedure Laterality Date   COLONOSCOPY     TONSILLECTOMY     TONSILLECTOMY     as a child   Allergies  Allergen Reactions   Alka-Seltzer Plus Sinus [Phenylephrine-Aspirin] Hives and Itching    Plain alka-seltzer.   Shellfish Allergy Anaphylaxis    Shrimp.    Family History  Problem Relation Age of Onset   Heart disease Father    Early death Father    Heart attack Father    Diabetes Father    Hearing loss Mother    Dementia Mother    Diabetes Maternal Grandfather    Colon cancer Neg Hx    Esophageal cancer Neg Hx    Stomach cancer Neg Hx    Rectal cancer Neg Hx     Social History   Socioeconomic History   Marital status: Married    Spouse name: Not on file   Number of children: 1   Years of education: Not on file   Highest education level: Not on file  Occupational History   Occupation: Professor    Associate Professor: [employer]  Tobacco Use   Smoking status: Former    Types: Cigarettes    Quit date: 11/07/2007    Years since quitting: 15.4   Smokeless tobacco:  Never  Vaping Use   Vaping Use: Never used  Substance and Sexual Activity   Alcohol use: Yes    Comment: One drink 3 times a week   Drug use: No   Sexual activity: Not on file  Other Topics Concern   Not on file  Social History Narrative   Daily caffeine    Social Determinants of Health   Financial Resource Strain: Not on file  Food Insecurity: Not on file  Transportation Needs: Not on file  Physical Activity: Not on file  Stress: Not on file  Social Connections: Not on file   Vitals:   05/03/23 0750  BP: 122/80  Pulse: 64  Resp: 16  Temp: 98.6 F (37 C)  SpO2: 98%   Body mass index is 22.17 kg/m.  Wt Readings from Last 3 Encounters:  05/03/23 153 lb 6 oz (69.6 kg)  03/02/22 160 lb 2 oz (72.6 kg)  11/06/18  158 lb (71.7 kg)   Physical Exam Vitals and nursing note reviewed.  Constitutional:      General: He is not in acute distress.    Appearance: He is well-developed.  HENT:     Head: Normocephalic and atraumatic.      Right Ear: Tympanic membrane, ear canal and external ear normal.     Left Ear: Tympanic membrane and external ear normal.     Ears:     Comments: Left ear canal mildly scaly and a small superficial excoriation on lower aspect of the concha, 1-2 mm, not tender or erythematous.    Mouth/Throat:     Mouth: Mucous membranes are moist.     Pharynx: Oropharynx is clear.  Eyes:     Extraocular Movements: Extraocular movements intact.     Conjunctiva/sclera: Conjunctivae normal.     Pupils: Pupils are equal, round, and reactive to light.  Neck:     Thyroid: No thyroid mass.  Cardiovascular:     Rate and Rhythm: Normal rate and regular rhythm.     Pulses:          Dorsalis pedis pulses are 2+ on the right side and 2+ on the left side.     Heart sounds: No murmur heard. Pulmonary:     Effort: Pulmonary effort is normal. No respiratory distress.     Breath sounds: Normal breath sounds.  Abdominal:     Palpations: Abdomen is soft. There is no  hepatomegaly or mass.     Tenderness: There is no abdominal tenderness.  Genitourinary:    Comments: No concerns. Musculoskeletal:        General: No tenderness.     Cervical back: Normal range of motion.     Comments: No signs of synovitis.  Lymphadenopathy:     Head:     Right side of head: No preauricular or posterior auricular adenopathy.     Left side of head: No preauricular or posterior auricular adenopathy.     Cervical: No cervical adenopathy.     Upper Body:     Right upper body: No supraclavicular adenopathy.     Left upper body: No supraclavicular adenopathy.  Skin:    General: Skin is warm.     Findings: No erythema.     Comments: No suspicious lesions, a few cherry moles and 2-3 hyperpigmented lesions on back and upper extremities.  Neurological:     General: No focal deficit present.     Mental Status: He is alert and oriented to person, place, and time.     Cranial Nerves: No cranial nerve deficit.     Sensory: No sensory deficit.     Gait: Gait normal.     Deep Tendon Reflexes:     Reflex Scores:      Bicep reflexes are 2+ on the right side and 2+ on the left side.      Patellar reflexes are 2+ on the right side and 2+ on the left side. Psychiatric:        Mood and Affect: Mood and affect normal.   ASSESSMENT AND PLAN:  Mr. Rontae Vinas was seen today for annual exam and follow up.  Diagnoses and all orders for this visit: Lab Results  Component Value Date   CREATININE 1.00 05/03/2023   BUN 14 05/03/2023   NA 140 05/03/2023   K 4.4 05/03/2023   CL 104 05/03/2023   CO2 27 05/03/2023   Lab Results  Component Value Date  ALT 18 05/03/2023   AST 18 05/03/2023   ALKPHOS 87 05/03/2023   BILITOT 0.9 05/03/2023   Lab Results  Component Value Date   CHOL 199 05/03/2023   HDL 55.30 05/03/2023   LDLCALC 123 (H) 05/03/2023   TRIG 103.0 05/03/2023   CHOLHDL 4 05/03/2023   Lab Results  Component Value Date   PSA 2.86 05/03/2023   PSA 1.87  03/02/2022   PSA 2.59 09/25/2018   Routine general medical examination at a health care facility Assessment & Plan: We discussed the importance of regular physical activity and healthy diet for prevention of chronic illness and/or complications. Preventive guidelines reviewed. Vaccination: Declined shingrix and Prevnar 20. Recommend calling back his GI to schedule colonoscopy. We discussed some recommendations about DEXA in men, according to USPSTF, current evidence in insufficient to assess balance of benefits and harm. He is not sure about insurance coverage, he will let me know if he wants to proceed with test. Next CPE in a year.   Hyperlipidemia, unspecified hyperlipidemia type Assessment & Plan: Rosuvastatin was recommended in 02/2022, declined. Non pharmacologic treatment to continue for now.  Orders: -     Comprehensive metabolic panel; Future -     Lipid panel; Future  Prostate cancer screening -     PSA; Future  Encounter for HCV screening test for low risk patient -     Hepatitis C antibody; Future  Seborrheic keratosis Assessment & Plan: On scalp, discussed differential Dx. Continue monitoring for changes. Offered derma referral for this problem, he prefers to hold at this time.   In regard to "skin check", explained it may not be cover by his health insurance, no suspicious lesions on examination: Small skin hemangiomas and hyperpigmented macular lesions < 3 mm. He decided to hold on referral for now.   Seborrheic dermatitis Assessment & Plan: Left external ear. Discussed Dx and prognosis. He reports no significant pruritus. OTC Cortizone cream, small amount on affected area daily as needed. We reviewed some side effects of topical steroids. F/U as needed.   Numbness and tingling of left lower extremity Assessment & Plan: Reporting great improvement. Lumbar MRI in 03/2022 with degenerative changes and mild-moderate lumbar stenosis I do not think further  work up is needed at this time.  Monitor for new symptoms. Instructed about warning signs.    Return in 1 year (on 05/02/2024) for CPE.  Zeriah Baysinger G. [place], MD  [facility]. [place] office.

## 2023-05-03 ENCOUNTER — Encounter: Payer: Self-pay | Admitting: Family Medicine

## 2023-05-03 ENCOUNTER — Ambulatory Visit (INDEPENDENT_AMBULATORY_CARE_PROVIDER_SITE_OTHER): Payer: BC Managed Care – PPO | Admitting: Family Medicine

## 2023-05-03 VITALS — BP 122/80 | HR 64 | Temp 98.6°F | Resp 16 | Ht 69.75 in | Wt 153.4 lb

## 2023-05-03 DIAGNOSIS — R202 Paresthesia of skin: Secondary | ICD-10-CM | POA: Diagnosis not present

## 2023-05-03 DIAGNOSIS — L219 Seborrheic dermatitis, unspecified: Secondary | ICD-10-CM | POA: Diagnosis not present

## 2023-05-03 DIAGNOSIS — Z125 Encounter for screening for malignant neoplasm of prostate: Secondary | ICD-10-CM | POA: Diagnosis not present

## 2023-05-03 DIAGNOSIS — E785 Hyperlipidemia, unspecified: Secondary | ICD-10-CM | POA: Diagnosis not present

## 2023-05-03 DIAGNOSIS — Z0000 Encounter for general adult medical examination without abnormal findings: Secondary | ICD-10-CM | POA: Diagnosis not present

## 2023-05-03 DIAGNOSIS — L821 Other seborrheic keratosis: Secondary | ICD-10-CM

## 2023-05-03 DIAGNOSIS — Z1159 Encounter for screening for other viral diseases: Secondary | ICD-10-CM

## 2023-05-03 DIAGNOSIS — Z13220 Encounter for screening for lipoid disorders: Secondary | ICD-10-CM

## 2023-05-03 DIAGNOSIS — Z130 Encounter for screening for diseases of the blood and blood-forming organs and certain disorders involving the immune mechanism: Secondary | ICD-10-CM

## 2023-05-03 DIAGNOSIS — R200 Anesthesia of skin: Secondary | ICD-10-CM

## 2023-05-03 LAB — COMPREHENSIVE METABOLIC PANEL
ALT: 18 U/L (ref 0–53)
AST: 18 U/L (ref 0–37)
Albumin: 4.1 g/dL (ref 3.5–5.2)
Alkaline Phosphatase: 87 U/L (ref 39–117)
BUN: 14 mg/dL (ref 6–23)
CO2: 27 mEq/L (ref 19–32)
Calcium: 9.2 mg/dL (ref 8.4–10.5)
Chloride: 104 mEq/L (ref 96–112)
Creatinine, Ser: 1 mg/dL (ref 0.40–1.50)
GFR: 77.53 mL/min (ref >60.00)
Glucose, Bld: 89 mg/dL (ref 70–99)
Potassium: 4.4 mEq/L (ref 3.5–5.1)
Sodium: 140 mEq/L (ref 135–145)
Total Bilirubin: 0.9 mg/dL (ref 0.2–1.2)
Total Protein: 6.7 g/dL (ref 6.0–8.3)

## 2023-05-03 LAB — LIPID PANEL
Cholesterol: 199 mg/dL (ref 0–200)
HDL: 55.3 mg/dL (ref >39.00)
LDL Cholesterol: 123 mg/dL — ABNORMAL HIGH (ref 0–99)
NonHDL: 143.87
Total CHOL/HDL Ratio: 4
Triglycerides: 103 mg/dL (ref 0.0–149.0)
VLDL: 20.6 mg/dL (ref 0.0–40.0)

## 2023-05-03 LAB — PSA: PSA: 2.86 ng/mL (ref 0.10–4.00)

## 2023-05-03 NOTE — Assessment & Plan Note (Addendum)
Rosuvastatin was recommended in 02/2022, declined. Non pharmacologic treatment to continue for now.

## 2023-05-03 NOTE — Patient Instructions (Addendum)
A few things to remember from today's visit:  Routine general medical examination at a health care facility  Screening for endocrine, metabolic, and immunity disorder  Hyperlipidemia, unspecified hyperlipidemia type - Plan: Comprehensive metabolic panel, Lipid panel  Prostate cancer screening - Plan: PSA  Encounter for HCV screening test for low risk patient - Plan: Hepatitis C antibody  Seborrheic keratosis  Let me know if you are interested in appt with dermatologist or having a bone density. Monitor pain on area anterior to left ear, if worsening or skin changes please let us know.  Do not use My Chart to request refills or for acute issues that need immediate attention. If you send a my chart message, it may take a few days to be addressed, specially if I am not in the office.   Please be sure medication list is accurate. If a new problem present, please set up appointment sooner than planned today.

## 2023-05-03 NOTE — Assessment & Plan Note (Addendum)
We discussed the importance of regular physical activity and healthy diet for prevention of chronic illness and/or complications. Preventive guidelines reviewed. Vaccination: Declined shingrix and Prevnar 20. Recommend calling back his GI to schedule colonoscopy. We discussed some recommendations about DEXA in men, according to USPSTF, current evidence in insufficient to assess balance of benefits and harm. He is not sure about insurance coverage, he will let me know if he wants to proceed with test. Next CPE in a year.

## 2023-05-04 ENCOUNTER — Encounter: Payer: Self-pay | Admitting: Family Medicine

## 2023-05-04 LAB — HEPATITIS C ANTIBODY: Hepatitis C Ab: NONREACTIVE

## 2023-05-07 NOTE — Assessment & Plan Note (Signed)
Reporting great improvement. Lumbar MRI in 03/2022 with degenerative changes and mild-moderate lumbar stenosis I do not think further work up is needed at this time.  Monitor for new symptoms. Instructed about warning signs.

## 2023-05-07 NOTE — Assessment & Plan Note (Signed)
Left external ear. Discussed Dx and prognosis. He reports no significant pruritus. OTC Cortizone cream, small amount on affected area daily as needed. We reviewed some side effects of topical steroids. F/U as needed.

## 2023-05-07 NOTE — Assessment & Plan Note (Signed)
On scalp, discussed differential Dx. Continue monitoring for changes. Offered derma referral for this problem, he prefers to hold at this time.   In regard to "skin check", explained it may not be cover by his health insurance, no suspicious lesions on examination: Small skin hemangiomas and hyperpigmented macular lesions < 3 mm. He decided to hold on referral for now.

## 2023-07-06 ENCOUNTER — Ambulatory Visit (AMBULATORY_SURGERY_CENTER): Payer: BC Managed Care – PPO

## 2023-07-06 ENCOUNTER — Encounter: Payer: Self-pay | Admitting: Gastroenterology

## 2023-07-06 VITALS — Ht 71.0 in | Wt 154.0 lb

## 2023-07-06 DIAGNOSIS — Z86010 Personal history of colonic polyps: Secondary | ICD-10-CM

## 2023-07-06 MED ORDER — NA SULFATE-K SULFATE-MG SULF 17.5-3.13-1.6 GM/177ML PO SOLN
1.0000 | Freq: Once | ORAL | 0 refills | Status: AC
Start: 2023-07-06 — End: 2023-07-06

## 2023-07-06 NOTE — Progress Notes (Signed)
No egg or soy allergy known to patient  No issues known to pt with past sedation with any surgeries or procedures Patient denies ever being told they had issues or difficulty with intubation  No FH of Malignant Hyperthermia Pt is not on diet pills Pt is not on  home 02  Pt is not on blood thinners  Pt denies issues with constipation  No A fib or A flutter Have any cardiac testing pending--no Pt can ambulate independently Pt denies use of chewing tobacco Discussed diabetic I weight loss medication holds Discussed NSAID holds Checked BMI Pt instructed to use Singlecare.com or GoodRx for a price reduction on prep  Patient's chart reviewed by Dustin Schmidt CNRA prior to previsit and patient appropriate for the [facility].  Pre visit completed and red dot placed by patient's name on their procedure day (on provider's schedule).

## 2023-07-27 ENCOUNTER — Encounter: Payer: Self-pay | Admitting: Gastroenterology

## 2023-07-27 ENCOUNTER — Ambulatory Visit (AMBULATORY_SURGERY_CENTER): Payer: BC Managed Care – PPO | Admitting: Gastroenterology

## 2023-07-27 VITALS — BP 112/90 | HR 62 | Temp 96.2°F | Resp 13 | Ht 71.0 in | Wt 154.0 lb

## 2023-07-27 DIAGNOSIS — D122 Benign neoplasm of ascending colon: Secondary | ICD-10-CM

## 2023-07-27 DIAGNOSIS — Z86010 Personal history of colonic polyps: Secondary | ICD-10-CM

## 2023-07-27 DIAGNOSIS — Z09 Encounter for follow-up examination after completed treatment for conditions other than malignant neoplasm: Secondary | ICD-10-CM | POA: Diagnosis present

## 2023-07-27 DIAGNOSIS — D123 Benign neoplasm of transverse colon: Secondary | ICD-10-CM | POA: Diagnosis not present

## 2023-07-27 MED ORDER — SODIUM CHLORIDE 0.9 % IV SOLN: 500.0000 mL | Freq: Once | INTRAVENOUS | Status: AC

## 2023-07-27 NOTE — Progress Notes (Signed)
Pt's states no medical or surgical changes since previsit or office visit. VS assessed by C.W 

## 2023-07-27 NOTE — Progress Notes (Signed)
[facility] History and Physical   Primary Care Physician:  [place], Betty G, MD   Reason for Procedure:  History of adenomatous colon polyps  Plan:    Surveillance colonoscopy with possible interventions as needed     HPI: Dustin Schmidt is a very pleasant 67 y.o. male here for surveillance colonoscopy. Denies any nausea, vomiting, abdominal pain, melena or bright red blood per rectum  The risks and benefits as well as alternatives of endoscopic procedure(s) have been discussed and reviewed. All questions answered. The patient agrees to proceed.    Past Medical History:  Diagnosis Date   Adenomatous colon polyp    Allergy    Generalized headaches    GERD (gastroesophageal reflux disease)    Hyperlipidemia     Past Surgical History:  Procedure Laterality Date   COLONOSCOPY     TONSILLECTOMY     TONSILLECTOMY     as a child    Prior to Admission medications   Medication Sig Start Date End Date Taking? Authorizing Provider  cyanocobalamin (VITAMIN B12) 500 MCG tablet Take 500 mcg by mouth daily.   Yes [provider]  Garlic 2 MG CAPS Take by mouth.   Yes [provider]  Garlic 200 MG TABS Take 200 mg by mouth once a week.   Yes [provider]  Ascorbic Acid (VITAMIN C) 1000 MG tablet Take 1,000 mg by mouth as needed. Patient not taking: Reported on 07/27/2023    [provider]  clotrimazole-betamethasone (LOTRISONE) cream Apply 1 application. topically daily as needed. Around ears, small amount. Patient not taking: Reported on 07/06/2023 03/02/22   [place], Betty G, MD  gabapentin (NEURONTIN) 100 MG capsule Take 1 capsule (100 mg total) by mouth at bedtime. Patient not taking: Reported on 07/06/2023 03/02/22   [place], Betty G, MD  Nettle, Urtica Dioica, (NETTLE LEAF PO) Take 900 mg by mouth as needed.    [provider]    Current Outpatient Medications  Medication Sig Dispense Refill   cyanocobalamin  (VITAMIN B12) 500 MCG tablet Take 500 mcg by mouth daily.     Garlic 2 MG CAPS Take by mouth.     Garlic 200 MG TABS Take 200 mg by mouth once a week.     Ascorbic Acid (VITAMIN C) 1000 MG tablet Take 1,000 mg by mouth as needed. (Patient not taking: Reported on 07/27/2023)     clotrimazole-betamethasone (LOTRISONE) cream Apply 1 application. topically daily as needed. Around ears, small amount. (Patient not taking: Reported on 07/06/2023) 30 g 1   gabapentin (NEURONTIN) 100 MG capsule Take 1 capsule (100 mg total) by mouth at bedtime. (Patient not taking: Reported on 07/06/2023) 30 capsule 1   Nettle, Urtica Dioica, (NETTLE LEAF PO) Take 900 mg by mouth as needed.     Current Facility-Administered Medications  Medication Dose Route Frequency Provider Last Rate Last Admin   0.9 %  sodium chloride infusion  500 mL Intravenous Once Napoleon Form, MD        Allergies as of 07/27/2023 - Review Complete 07/27/2023  Allergen Reaction Noted   Alka-seltzer plus sinus [phenylephrine-aspirin] Hives and Itching 05/01/2012   Shellfish allergy Anaphylaxis 05/01/2012    Family History  Problem Relation Age of Onset   Heart disease Father    Early death Father    Heart attack Father    Diabetes Father    Hearing loss Mother    Dementia Mother    Diabetes Maternal Grandfather  Colon cancer Neg Hx    Esophageal cancer Neg Hx    Stomach cancer Neg Hx    Rectal cancer Neg Hx     Social History   Socioeconomic History   Marital status: Married    Spouse name: Not on file   Number of children: 1   Years of education: Not on file   Highest education level: Not on file  Occupational History   Occupation: Professor    Employer: [employer]  Tobacco Use   Smoking status: Former    Current packs/day: 0.00    Types: Cigarettes    Quit date: 11/07/2007    Years since quitting: 15.7   Smokeless tobacco: Never  Vaping Use   Vaping status: Never Used  Substance and Sexual Activity   Alcohol  use: Yes    Comment: One drink 3 times a week   Drug use: No   Sexual activity: Not on file  Other Topics Concern   Not on file  Social History Narrative   Daily caffeine    Social Determinants of Health   Financial Resource Strain: Not on file  Food Insecurity: Not on file  Transportation Needs: Not on file  Physical Activity: Not on file  Stress: Not on file  Social Connections: Not on file  Intimate Partner Violence: Not on file    Review of Systems:  All other review of systems negative except as mentioned in the HPI.  Physical Exam: Vital signs in last 24 hours: BP 133/85   Pulse 63   Temp (!) 96.2 F (35.7 C) (Skin)   Resp 12   Ht 5\' 11"  (1.803 m)   Wt 154 lb (69.9 kg)   SpO2 98%   BMI 21.48 kg/m  General:   Alert, NAD Lungs:  Clear .   Heart:  Regular rate and rhythm Abdomen:  Soft, nontender and nondistended. Neuro/Psych:  Alert and cooperative. Normal mood and affect. A and O x 3  Reviewed labs, radiology imaging, old records and pertinent past GI work up  Patient is appropriate for planned procedure(s) and anesthesia in an ambulatory setting   K. Scherry Ran , MD [phone]

## 2023-07-27 NOTE — Progress Notes (Signed)
Sedate, gd SR, tolerated procedure well, VSS, report to RN 

## 2023-07-27 NOTE — Patient Instructions (Signed)
YOU HAD AN ENDOSCOPIC PROCEDURE TODAY AT THE [facility]:   Refer to the procedure report that was given to you for any specific questions about what was found during the examination.  If the procedure report does not answer your questions, please call your gastroenterologist to clarify.  If you requested that your care partner not be given the details of your procedure findings, then the procedure report has been included in a sealed envelope for you to review at your convenience later.  YOU SHOULD EXPECT: Some feelings of bloating in the abdomen. Passage of more gas than usual.  Walking can help get rid of the air that was put into your GI tract during the procedure and reduce the bloating. If you had a lower endoscopy (such as a colonoscopy or flexible sigmoidoscopy) you may notice spotting of blood in your stool or on the toilet paper. If you underwent a bowel prep for your procedure, you may not have a normal bowel movement for a few days.  Please Note:  You might notice some irritation and congestion in your nose or some drainage.  This is from the oxygen used during your procedure.  There is no need for concern and it should clear up in a day or so.  SYMPTOMS TO REPORT IMMEDIATELY:  Following lower endoscopy (colonoscopy or flexible sigmoidoscopy):  Excessive amounts of blood in the stool  Significant tenderness or worsening of abdominal pains  Swelling of the abdomen that is new, acute  Fever of 100F or higher  For urgent or emergent issues, a gastroenterologist can be reached at any hour by calling [phone]. Do not use MyChart messaging for urgent concerns.    DIET:  We do recommend a small meal at first, but then you may proceed to your regular diet.  Drink plenty of fluids but you should avoid alcoholic beverages for 24 hours.   MEDICATIONS: Continue present medications.  Please see handouts given to you by your recovery nurse: Polyps, Diverticulosis,  Hemorrhoids.  FOLLOW UP: Await pathology results. Repeat colonoscopy in 3-5 years for surveillance.  Thank you for allowing [place] to provide for your healthcare needs today.  ACTIVITY:  You should plan to take it easy for the rest of today and you should NOT DRIVE or use heavy machinery until tomorrow (because of the sedation medicines used during the test).    FOLLOW UP: Our staff will call the number listed on your records the next business day following your procedure.  We will call around 7:15- 8:00 am to check on you and address any questions or concerns that you may have regarding the information given to you following your procedure. If we do not reach you, we will leave a message.     If any biopsies were taken you will be contacted by phone or by letter within the next 1-3 weeks.  Please call us at [phone] if you have not heard about the biopsies in 3 weeks.    SIGNATURES/CONFIDENTIALITY: You and/or your care partner have signed paperwork which will be entered into your electronic medical record.  These signatures attest to the fact that that the information above on your After Visit Summary has been reviewed and is understood.  Full responsibility of the confidentiality of this discharge information lies with you and/or your care-partner.

## 2023-07-27 NOTE — Progress Notes (Signed)
Called to room to assist during endoscopic procedure.  Patient ID and intended procedure confirmed with present staff. Received instructions for my participation in the procedure from the performing physician.  

## 2023-07-27 NOTE — Op Note (Signed)
[facility] Patient Name: Dustin Schmidt Procedure Date: 07/27/2023 9:14 AM MRN: [id number] Endoscopist: Napoleon Form , MD, [id number] Age: 67 Referring MD:  Date of Birth: March 13, 1956 Gender: Male Account #: [id number] Procedure:                Colonoscopy Indications:              High risk colon cancer surveillance: Personal                            history of multiple (3 or more) adenomas, High risk                            colon cancer surveillance: Personal history of                            adenoma less than 10 mm in size Medicines:                Monitored Anesthesia Care Procedure:                Pre-Anesthesia Assessment:                           - Prior to the procedure, a History and Physical                            was performed, and patient medications and                            allergies were reviewed. The patient's tolerance of                            previous anesthesia was also reviewed. The risks                            and benefits of the procedure and the sedation                            options and risks were discussed with the patient.                            All questions were answered, and informed consent                            was obtained. Prior Anticoagulants: The patient has                            taken no anticoagulant or antiplatelet agents. ASA                            Grade Assessment: II - A patient with mild systemic                            disease. After reviewing the risks and benefits,  the patient was deemed in satisfactory condition to                            undergo the procedure.                           After obtaining informed consent, the colonoscope                            was passed under direct vision. Throughout the                            procedure, the patient's blood pressure, pulse, and                            oxygen saturations were  monitored continuously. The                            Olympus PCF-[id number] (#[id number]) Colonoscope was                            introduced through the anus and advanced to the the                            cecum, identified by appendiceal orifice and                            ileocecal valve. The colonoscopy was performed                            without difficulty. The patient tolerated the                            procedure well. The quality of the bowel                            preparation was good. The ileocecal valve,                            appendiceal orifice, and rectum were photographed. Scope In: 9:25:16 AM Scope Out: 9:47:42 AM Scope Withdrawal Time: 0 hours 15 minutes 52 seconds  Total Procedure Duration: 0 hours 22 minutes 26 seconds  Findings:                 The perianal and digital rectal examinations were                            normal.                           Five sessile polyps were found in the transverse                            colon and ascending colon. The polyps were 3 to 7  mm in size. These polyps were removed with a cold                            snare. Resection and retrieval were complete.                           Scattered small-mouthed diverticula were found in                            the sigmoid colon and descending colon.                           Non-bleeding external and internal hemorrhoids were                            found during retroflexion. The hemorrhoids were                            small. Complications:            No immediate complications. Estimated Blood Loss:     Estimated blood loss was minimal. Impression:               - Five 3 to 7 mm polyps in the transverse colon and                            in the ascending colon, removed with a cold snare.                            Resected and retrieved.                           - Diverticulosis in the sigmoid colon and in the                             descending colon.                           - Non-bleeding external and internal hemorrhoids. Recommendation:           - Patient has a contact number available for                            emergencies. The signs and symptoms of potential                            delayed complications were discussed with the                            patient. Return to normal activities tomorrow.                            Written discharge instructions were provided to the                            patient.                           -  Resume previous diet.                           - Continue present medications.                           - Await pathology results.                           - Repeat colonoscopy in 3 - 5 years for                            surveillance. Napoleon Form, MD 07/27/2023 9:55:28 AM This report has been signed electronically.

## 2023-07-28 ENCOUNTER — Telehealth: Payer: Self-pay

## 2023-07-28 NOTE — Telephone Encounter (Signed)
  Follow up Call-     07/27/2023    8:38 AM  Call back number  Post procedure Call Back phone  # [phone]  Permission to leave phone message Yes     Patient questions:  Do you have a fever, pain , or abdominal swelling? No. Pain Score  0 *  Have you tolerated food without any problems? Yes.    Have you been able to return to your normal activities? Yes.    Do you have any questions about your discharge instructions: Diet   No. Medications  No. Follow up visit  No.  Do you have questions or concerns about your Care? No.  Actions: * If pain score is 4 or above: No action needed, pain <4.

## 2023-08-11 ENCOUNTER — Encounter: Payer: Self-pay | Admitting: Gastroenterology

## 2023-11-26 IMAGING — MR MR LUMBAR SPINE W/O CM
4 of 5 series · 24 of 48 positions shown · non-contrast
Comparison: No pertinent prior exams available for comparison.

CLINICAL DATA: Lumbar radiculopathy, symptoms persist with greater
than 6 weeks treatment. Left lower extremity pain and tingling,
worsening for the past year.

EXAM:
MRI LUMBAR SPINE WITHOUT CONTRAST
TECHNIQUE: Multiplanar, multisequence MR imaging of the lumbar spine was
performed. No intravenous contrast was administered.

[Series 2: T2 · sagittal · 4.0mm · 0.81mm/px · 6 of 15 slices shown (1 of 2)]
[im 1/15]
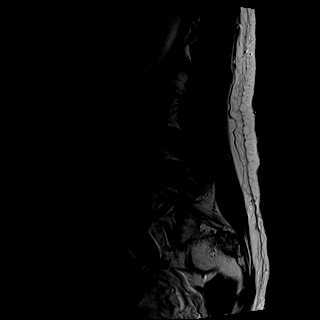
[im 3/15]
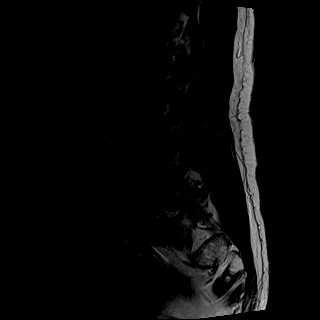
[im 6/15]
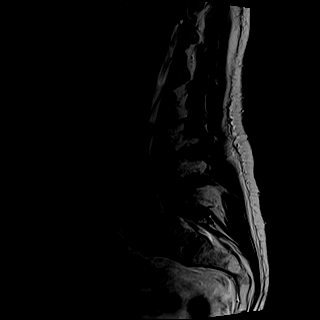
[im 9/15]
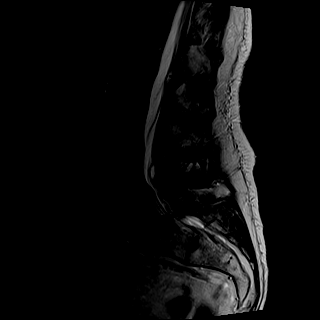
[im 12/15]
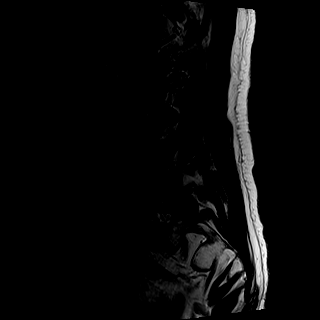
[im 15/15]
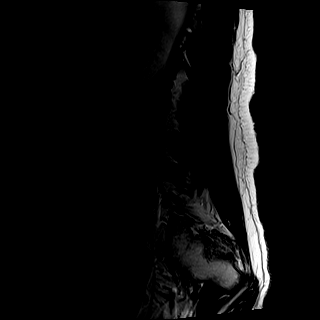

[Series 3: T1 · sagittal · 4.0mm · 0.41mm/px · 6 of 15 slices shown (1 of 2)]
[im 1/15]
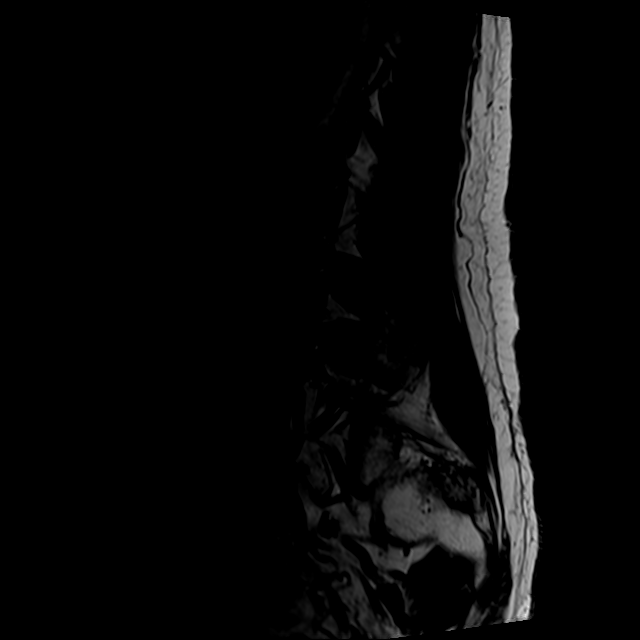
[im 3/15]
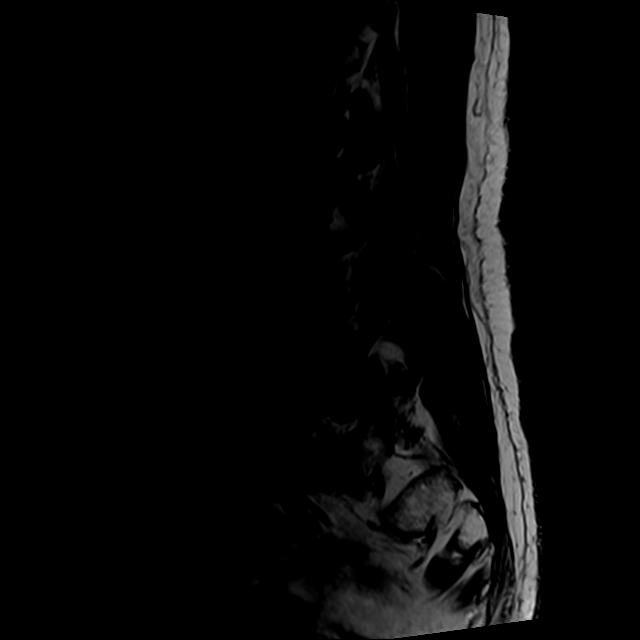
[im 6/15]
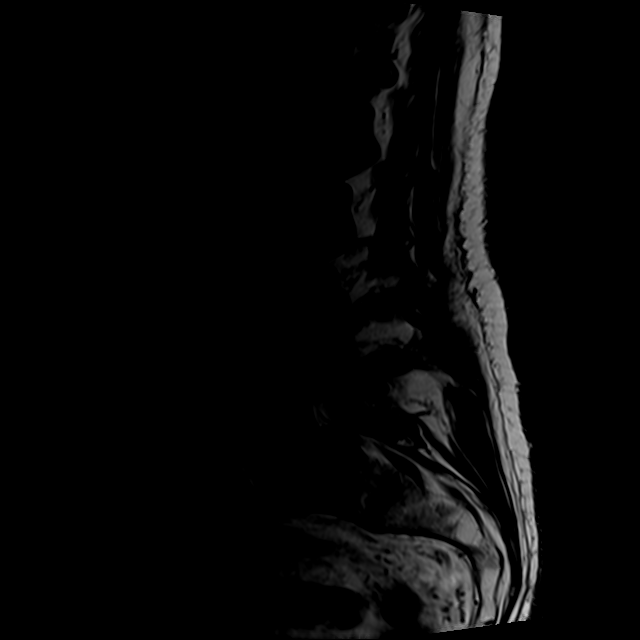
[im 9/15]
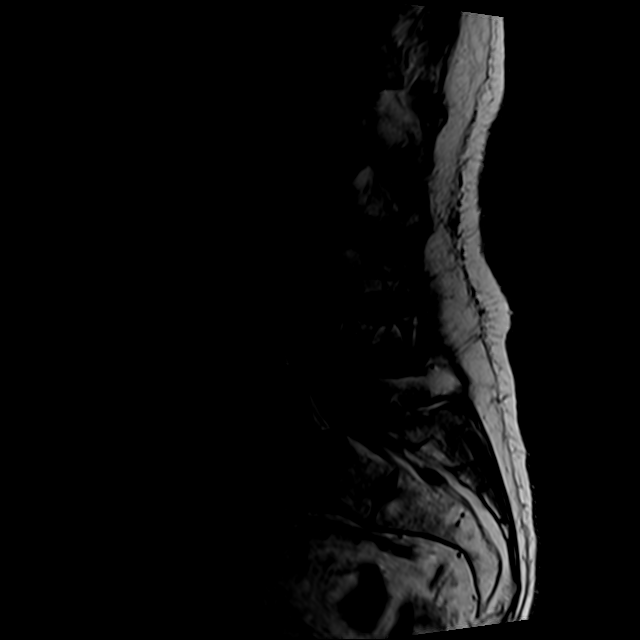
[im 12/15]
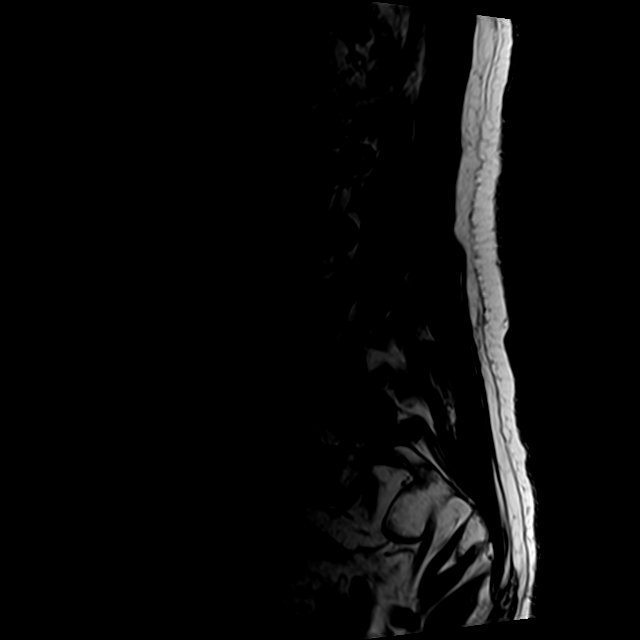
[im 15/15]
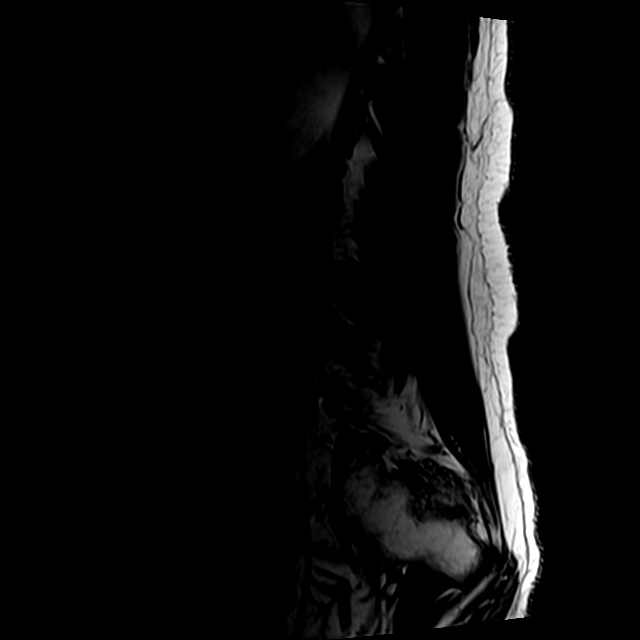

[Series 5: T2 · axial · 4.0mm · 0.78mm/px · z∈[-97,+157]mm · 9 of 41 slices shown (2 of 2)]
[im 1/41]
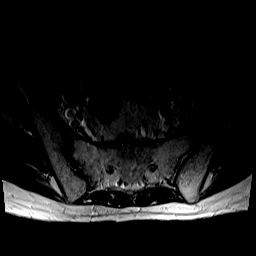
[im 6/41]
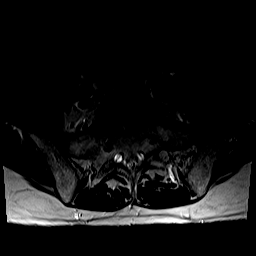
[im 12/41]
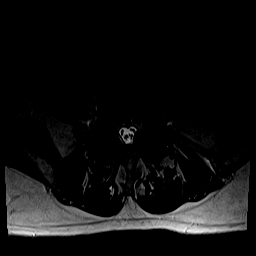
[im 18/41]
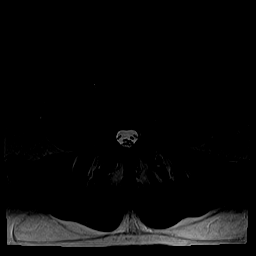
[im 21/41]
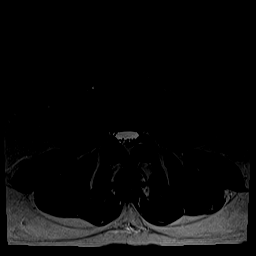
[im 23/41]
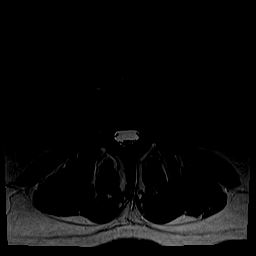
[im 29/41]
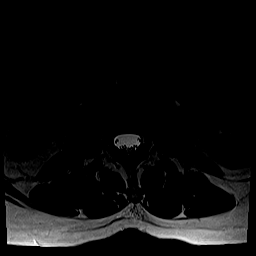
[im 35/41]
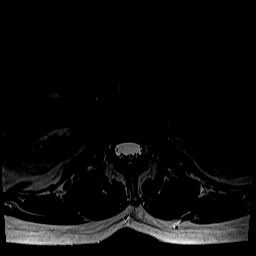
[im 41/41]
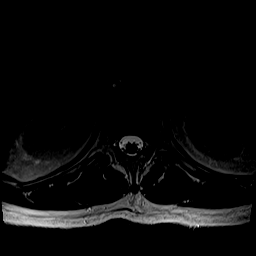

[Series 6: T1 · axial · 4.0mm · 0.39mm/px · z∈[-73,+104]mm · 3 of 41 slices shown (2 of 2)]
[im 6/41]
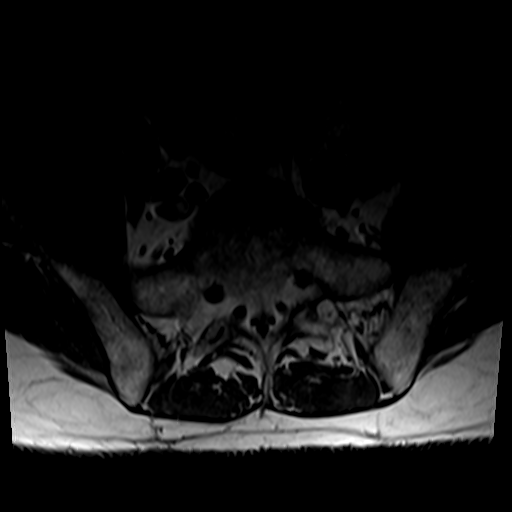
[im 21/41]
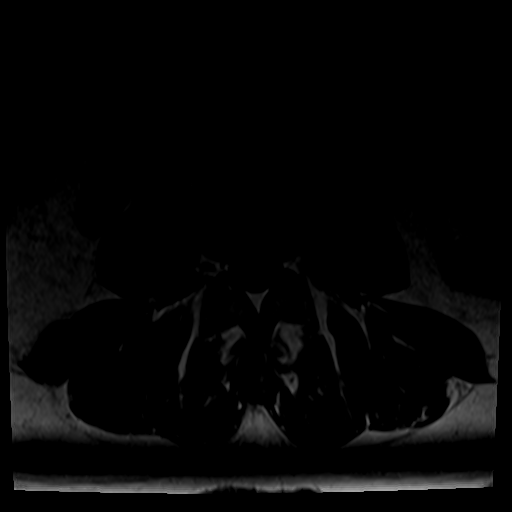
[im 35/41]
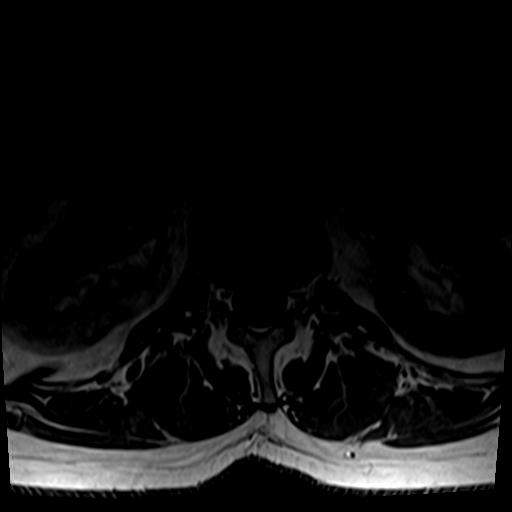

[24 of 48 positions shown; findings below may reference images not displayed]

FINDINGS: Mild intermittent motion degradation.

Segmentation: 5 lumbar vertebrae are assumed and the caudal most
well-formed indeed Dadado disc space is designated L5-S1. Rudimentary
disc space at S1-S2.

Alignment:  3 mm grade 1 anterolisthesis at L4-L5 and L5-S1.

Vertebrae: No lumbar vertebral compression fracture. Mild edema
within the posterior elements on the right at L5-S1, likely
degenerative and related to facet arthrosis. Elsewhere, no
significant marrow edema or focal suspicious osseous lesion is
identified.

Conus medullaris and cauda equina: Conus extends to the L1-L2 level.
No signal abnormality within the visualized distal spinal cord.

Paraspinal and other soft tissues: No abnormality identified within
included portions of the abdomen/retroperitoneum. Paraspinal soft
tissues unremarkable.

Disc levels:

Mild multilevel disc degeneration, greatest at L5-S1.

T12-L1: No significant disc herniation or stenosis.

L1-L2: No significant disc herniation or stenosis.

L2-L3: No significant disc herniation or stenosis.

L3-L4: Slight disc bulge. Mild facet arthrosis. Minimal left
subarticular narrowing (without appreciable nerve root impingement).
Central canal patent. No significant foraminal stenosis.

L4-L5: 3 mm grade 1 anterolisthesis. Disc uncovering with disc
bulge. Advanced facet arthrosis with ligamentum flavum hypertrophy.
Small bilateral facet joint effusions. Moderate right subarticular
stenosis with potential to affect the descending right L5 nerve root
(series 5, image 27). Mild left subarticular narrowing (without
appreciable nerve root impingement at this site). Moderate central
canal stenosis. Mild left neural foraminal narrowing.

L5-S1: 3 mm grade 1 anterolisthesis. Disc uncovering with disc
bulge. Advanced facet arthrosis with ligamentum flavum hypertrophy.
Bilateral subarticular stenosis with potential to affect either
descending S1 nerve root. Mild relative narrowing of the central
canal. Mild bilateral neural foraminal narrowing (greater on the
right).
IMPRESSION: Lumbar spondylosis, as outlined and with findings most notably as
follows.

At L5-S1, there is mild disc degeneration. 3 mm grade 1
anterolisthesis. Disc uncovering with disc bulge. Advanced facet
arthrosis with ligamentum flavum hypertrophy. Small bilateral facet
joint effusions. Bilateral subarticular stenosis with potential to
affect either descending S1 nerve root. Mild relative narrowing of
the central canal. Mild bilateral neural foraminal narrowing
(greater on the right).

At L4-L5, there is mild disc degeneration. 3 mm grade 1
anterolisthesis. Disc uncovering with disc bulge. Advanced facet
arthrosis with ligamentum flavum hypertrophy. Moderate right
subarticular stenosis with potential to affect the descending right
L5 nerve root. Mild left subarticular narrowing (without appreciable
nerve root impingement at this site). Moderate central canal
stenosis. Mild left neural foraminal narrowing.

Mild marrow edema within the posterior elements on the right at
L5-S1, likely degenerative and related to facet arthrosis.

## 2024-07-02 ENCOUNTER — Other Ambulatory Visit: Payer: Self-pay

## 2024-07-03 ENCOUNTER — Ambulatory Visit (INDEPENDENT_AMBULATORY_CARE_PROVIDER_SITE_OTHER): Admitting: Family Medicine

## 2024-07-03 ENCOUNTER — Encounter: Payer: Self-pay | Admitting: Family Medicine

## 2024-07-03 ENCOUNTER — Ambulatory Visit: Payer: Self-pay | Admitting: Family Medicine

## 2024-07-03 VITALS — BP 120/80 | HR 75 | Temp 98.1°F | Resp 16 | Ht 71.0 in | Wt 153.0 lb

## 2024-07-03 DIAGNOSIS — E785 Hyperlipidemia, unspecified: Secondary | ICD-10-CM

## 2024-07-03 DIAGNOSIS — E538 Deficiency of other specified B group vitamins: Secondary | ICD-10-CM | POA: Diagnosis not present

## 2024-07-03 DIAGNOSIS — E049 Nontoxic goiter, unspecified: Secondary | ICD-10-CM

## 2024-07-03 DIAGNOSIS — Z125 Encounter for screening for malignant neoplasm of prostate: Secondary | ICD-10-CM | POA: Diagnosis not present

## 2024-07-03 DIAGNOSIS — R053 Chronic cough: Secondary | ICD-10-CM

## 2024-07-03 DIAGNOSIS — Z0000 Encounter for general adult medical examination without abnormal findings: Secondary | ICD-10-CM

## 2024-07-03 LAB — COMPREHENSIVE METABOLIC PANEL WITH GFR
ALT: 16 U/L (ref 0–53)
AST: 16 U/L (ref 0–37)
Albumin: 4.3 g/dL (ref 3.5–5.2)
Alkaline Phosphatase: 76 U/L (ref 39–117)
BUN: 12 mg/dL (ref 6–23)
CO2: 27 meq/L (ref 19–32)
Calcium: 9 mg/dL (ref 8.4–10.5)
Chloride: 105 meq/L (ref 96–112)
Creatinine, Ser: 0.9 mg/dL (ref 0.40–1.50)
GFR: 87.26 mL/min (ref >60.00)
Glucose, Bld: 88 mg/dL (ref 70–99)
Potassium: 4.3 meq/L (ref 3.5–5.1)
Sodium: 139 meq/L (ref 135–145)
Total Bilirubin: 0.9 mg/dL (ref 0.2–1.2)
Total Protein: 6.3 g/dL (ref 6.0–8.3)

## 2024-07-03 LAB — LIPID PANEL
Cholesterol: 197 mg/dL (ref 0–200)
HDL: 53.1 mg/dL (ref >39.00)
LDL Cholesterol: 121 mg/dL — ABNORMAL HIGH (ref 0–99)
NonHDL: 143.87
Total CHOL/HDL Ratio: 4
Triglycerides: 115 mg/dL (ref 0.0–149.0)
VLDL: 23 mg/dL (ref 0.0–40.0)

## 2024-07-03 LAB — VITAMIN B12: Vitamin B-12: 636 pg/mL (ref 211–911)

## 2024-07-03 LAB — TSH: TSH: 1.28 u[IU]/mL (ref 0.35–5.50)

## 2024-07-03 LAB — PSA: PSA: 2.54 ng/mL (ref 0.10–4.00)

## 2024-07-03 NOTE — Patient Instructions (Addendum)
 A few things to remember from today's visit:  Routine general medical examination at a health care facility  Hyperlipidemia, unspecified hyperlipidemia type - Plan: Lipid Panel, CMP  Prostate cancer screening - Plan: PSA  B12 deficiency - Plan: Vitamin B12  Persistent cough  Enlarged thyroid  gland - Plan: TSH, US  THYROID   Do not use My Chart to request refills or for acute issues that need immediate attention. If you send a my chart message, it may take a few days to be addressed, specially if I am not in the office.  Please be sure medication list is accurate. If a new problem present, please set up appointment sooner than planned today.

## 2024-07-03 NOTE — Assessment & Plan Note (Signed)
 Continue current dose of vitamin B12 supplementation. Further recommendation will be given according to B12 result.

## 2024-07-03 NOTE — Progress Notes (Signed)
 Chief Complaint  Patient presents with   Annual Exam    Pt requesting annual blood work & b12   Cough   Follow-up   HPI: Mr. AVIAN KONIGSBERG is a 68 y.o.male with a PMHx significant for Allergic Rhinitis and Hyperlipidemia, here today for his routine physical examination and follow up + cough.  Last CPE: 05/03/2023  Exercise: swimming 3x/ week, walking 20 minutes after dinner Diet: home-cooking, vegetables daily Sleep: 7-8 hours nightly Alcohol Use: 3-4 drinks weekly, usually beer but occasionally rum or champagne  Smoking: former, quit 2008 Vision: last seen 2 years ago Dental: sees regularly   Health Maintenance  Topic Date Due   COVID-19 Vaccine (1) Never done   DTaP/Tdap/Td vaccine (1 - Tdap) Never done   Zoster (Shingles) Vaccine (1 of 2) Never done   Pneumococcal Vaccine for age over 72 (2 of 2 - PCV) 03/03/2023   Flu Shot  07/05/2024   Colon Cancer Screening  07/26/2026   Hepatitis C Screening  Completed   Hepatitis B Vaccine  Aged Out   HPV Vaccine  Aged Out   Meningitis B Vaccine  Aged Out   Immunization History  Administered Date(s) Administered   Pneumococcal Polysaccharide-23 03/02/2022   Chronic medical problems:   Hyperlipidemia: Currently not on pharmacological treatment Lab Results  Component Value Date   CHOL 199 05/03/2023   HDL 55.30 05/03/2023   LDLCALC 123 (H) 05/03/2023   TRIG 103.0 05/03/2023   CHOLHDL 4 05/03/2023   Vitamin B12 Deficiency managed with Vitamin B12 1000(?) mcg 2x/ week. Says that since he started taking this, the numbness/tingling in his leg has significantly improved - still having occasional instances lasting about 5 seconds. No edema or erythema.  Lab Results  Component Value Date   VITAMINB12 912 (H) 08/01/2022   Former Smoker; Cough: Mentions that his wife is concerned he may have COPD because he began coughing about 2-3 weeks ago. He says that cough tends to occur more so at night (around 6-7 PM) after  being outside.  Does endorse occasional heartburn, but denies difficulty breathing, rhinorrhea, nasal congestion, cough interfering w/ sleep, chest pain, or wheezing.  Says that he is capable of holding his breathing while swimming without any respiratory symptoms. Former Smoker, quit in 2008.  08/13/2013 CXR: hyperexpanded lungs & bronchitic change w/o acute cardiopulmonary disease.  In 02/2022, he mentioned a cough as well w/ CXR showing COPD. There is prominence of interstitial markings in the upper lung fields possibly suggesting scarring and interstitial lung disease. Peribronchial thickening suggests bronchitis. There is no focal pulmonary consolidation. There is no pleural effusion or pneumothorax.  Last prostate ca screening:  PSA 2.86 on 05/03/2023 Endorses nocturia about 2-3x nightly, stable.  Lab Results  Component Value Date   PSA 2.86 05/03/2023   PSA 1.87 03/02/2022   PSA 2.59 09/25/2018   Review of Systems  Constitutional:  Negative for activity change, appetite change and fever.  HENT:  Negative for nosebleeds, sore throat, trouble swallowing and voice change.   Eyes:  Negative for redness and visual disturbance.  Respiratory:  Positive for cough (2-3 weeks). Negative for shortness of breath and wheezing.   Cardiovascular:  Negative for chest pain, palpitations and leg swelling.  Gastrointestinal:  Negative for abdominal pain, blood in stool, nausea and vomiting.  Endocrine: Negative for cold intolerance, heat intolerance, polydipsia, polyphagia and polyuria.  Genitourinary:  Negative for decreased urine volume, dysuria, genital sores, hematuria and testicular pain.       (+)  Nocturia 2-3x nightly  Musculoskeletal:  Negative for gait problem and myalgias.  Skin:  Negative for color change and rash.  Allergic/Immunologic: Positive for environmental allergies.  Neurological:  Negative for syncope, weakness and headaches.  Hematological:  Negative for adenopathy. Does not  bruise/bleed easily.  Psychiatric/Behavioral:  Negative for confusion and sleep disturbance.    Current Outpatient Medications on File Prior to Visit  Medication Sig Dispense Refill   cyanocobalamin  (VITAMIN B12) 500 MCG tablet Take 500 mcg by mouth daily.     Garlic 200 MG TABS Take 200 mg by mouth once a week.     Nettle, Urtica Dioica, (NETTLE LEAF PO) Take 900 mg by mouth as needed.     Current Facility-Administered Medications on File Prior to Visit  Medication Dose Route Frequency Provider Last Rate Last Admin   0.9 %  sodium chloride  infusion  500 mL Intravenous Once Nandigam, Kavitha V, MD        Past Medical History:  Diagnosis Date   Adenomatous colon polyp    Allergy    Generalized headaches    GERD (gastroesophageal reflux disease)    Hyperlipidemia     Past Surgical History:  Procedure Laterality Date   COLONOSCOPY     TONSILLECTOMY     TONSILLECTOMY     as a child    Allergies  Allergen Reactions   Alka-Seltzer Plus Sinus [Phenylephrine-Aspirin] Hives and Itching    Plain alka-seltzer.   Shellfish Allergy Anaphylaxis    Shrimp.   Family History  Problem Relation Age of Onset   Heart disease Father    Early death Father    Heart attack Father    Diabetes Father    Hearing loss Mother    Dementia Mother    Diabetes Maternal Grandfather    Colon cancer Neg Hx    Esophageal cancer Neg Hx    Stomach cancer Neg Hx    Rectal cancer Neg Hx     Social History   Socioeconomic History   Marital status: Married    Spouse name: Not on file   Number of children: 1   Years of education: Not on file   Highest education level: Doctorate  Occupational History   Occupation: Professor    Employer: [employer]  Tobacco Use   Smoking status: Former    Current packs/day: 0.00    Types: Cigarettes    Quit date: 11/07/2007    Years since quitting: 16.6   Smokeless tobacco: Never  Vaping Use   Vaping status: Never Used  Substance and Sexual Activity   Alcohol  use: Yes    Alcohol/week: 9.0 standard drinks of alcohol    Types: 2 Glasses of wine, 5 Cans of beer, 2 Shots of liquor per week    Comment: One drink 3 times a week   Drug use: No   Sexual activity: Yes    Birth control/protection: None  Other Topics Concern   Not on file  Social History Narrative   Daily caffeine    Social Drivers of Health   Financial Resource Strain: Low Risk  (07/01/2024)   Overall Financial Resource Strain (CARDIA)    Difficulty of Paying Living Expenses: Not very hard  Food Insecurity: No Food Insecurity (07/01/2024)   Hunger Vital Sign    Worried About Running Out of Food in the Last Year: Never true    Ran Out of Food in the Last Year: Never true  Transportation Needs: No Transportation Needs (07/01/2024)   PRAPARE -  Administrator, Civil Service (Medical): No    Lack of Transportation (Non-Medical): No  Physical Activity: Sufficiently Active (07/01/2024)   Exercise Vital Sign    Days of Exercise per Week: 5 days    Minutes of Exercise per Session: 40 min  Stress: Stress Concern Present (07/01/2024)   [employer] of Occupational Health - Occupational Stress Questionnaire    Feeling of Stress: To some extent  Social Connections: Moderately Isolated (07/01/2024)   Social Connection and Isolation Panel    Frequency of Communication with Friends and Family: Twice a week    Frequency of Social Gatherings with Friends and Family: Once a week    Attends Religious Services: Never    Database administrator or Organizations: No    Attends Engineer, structural: Not on file    Marital Status: Married   Vitals:   07/03/24 0746  BP: 120/80  Pulse: 75  Resp: 16  Temp: 98.1 F (36.7 C)  SpO2: 97%   Body mass index is 21.34 kg/m.  Wt Readings from Last 3 Encounters:  07/03/24 153 lb (69.4 kg)  07/27/23 154 lb (69.9 kg)  07/06/23 154 lb (69.9 kg)   Physical Exam Vitals and nursing note reviewed.  Constitutional:      General: He  is not in acute distress.    Appearance: He is well-developed.  HENT:     Head: Normocephalic and atraumatic.     Right Ear: Tympanic membrane, ear canal and external ear normal.     Left Ear: Tympanic membrane, ear canal and external ear normal.  Eyes:     Extraocular Movements: Extraocular movements intact.     Conjunctiva/sclera: Conjunctivae normal.     Pupils: Pupils are equal, round, and reactive to light.  Neck:     Thyroid : Thyroid  mass (? thyroid  nodule) present. No thyromegaly.     Trachea: No tracheal deviation.  Cardiovascular:     Rate and Rhythm: Normal rate and regular rhythm.     Pulses:          Dorsalis pedis pulses are 2+ on the right side and 2+ on the left side.     Heart sounds: No murmur heard. Pulmonary:     Effort: Pulmonary effort is normal. No respiratory distress.     Breath sounds: Normal breath sounds.  Abdominal:     Palpations: Abdomen is soft. There is no hepatomegaly or mass.     Tenderness: There is no abdominal tenderness.  Genitourinary:    Comments: No concerns. Musculoskeletal:        General: No tenderness.     Cervical back: Normal range of motion.     Comments: No signs of synovitis.  Lymphadenopathy:     Cervical: No cervical adenopathy.     Upper Body:     Right upper body: No supraclavicular adenopathy.     Left upper body: No supraclavicular adenopathy.  Skin:    General: Skin is warm.     Findings: No erythema.  Neurological:     General: No focal deficit present.     Mental Status: He is alert and oriented to person, place, and time.     Cranial Nerves: No cranial nerve deficit.     Sensory: No sensory deficit.     Gait: Gait normal.     Deep Tendon Reflexes:     Reflex Scores:      Bicep reflexes are 2+ on the right side and 2+ on  the left side.      Patellar reflexes are 2+ on the right side and 2+ on the left side. Psychiatric:        Mood and Affect: Mood and affect normal.   ASSESSMENT AND PLAN:  Mr. KHYREN HING was seen here today for his routine physical examination and follow up.  Orders Placed This Encounter  Procedures   US  THYROID    DG Chest 2 View   Lipid Panel   CMP   PSA   Vitamin B12   TSH   Lab Results  Component Value Date   TSH 1.28 07/03/2024   Lab Results  Component Value Date   VITAMINB12 636 07/03/2024   Lab Results  Component Value Date   PSA 2.54 07/03/2024   PSA 2.86 05/03/2023   PSA 1.87 03/02/2022   Lab Results  Component Value Date   NA 139 07/03/2024   CL 105 07/03/2024   K 4.3 07/03/2024   CO2 27 07/03/2024   BUN 12 07/03/2024   CREATININE 0.90 07/03/2024   GFR 87.26 07/03/2024   CALCIUM 9.0 07/03/2024   ALBUMIN 4.3 07/03/2024   GLUCOSE 88 07/03/2024   Lab Results  Component Value Date   ALT 16 07/03/2024   AST 16 07/03/2024   ALKPHOS 76 07/03/2024   BILITOT 0.9 07/03/2024   Lab Results  Component Value Date   CHOL 197 07/03/2024   HDL 53.10 07/03/2024   LDLCALC 121 (H) 07/03/2024   TRIG 115.0 07/03/2024   CHOLHDL 4 07/03/2024   Routine general medical examination at a health care facility Assessment & Plan: We discussed the importance of regular physical activity and healthy diet for prevention of chronic illness and/or complications. Preventive guidelines reviewed. Vaccination: Declined vaccination.  Prevnar 20 and Shingrix are due. Inquiring about osteoporosis screening in men. We reviewed current recommendations from USPSTF as well as other groups (endocrinology society). Prefers to hold on DEXA but would like to have it done next year. Next CPE in a year.  Hyperlipidemia, unspecified hyperlipidemia type Assessment & Plan: Non pharmacologic treatment to continue. Further recommendations will be given according to 10 years CVD risk score and lipid panel numbers.  Orders: -     Lipid panel; Future -     Comprehensive metabolic panel with GFR; Future  Prostate cancer screening -     PSA; Future  B12  deficiency Assessment & Plan: Continue current dose of vitamin B12 supplementation. Further recommendation will be given according to B12 result.  Orders: -     Vitamin B12; Future  Persistent cough We discussed possible etiologies, including COPD, allergies, and GERD among some. CXR done in 02/2022 reviewed.  We discussed options: Observation, repeating chest x-ray, or outpatient chest CT.  He prefers to have chest x-ray done. He is not interested in PPI to treat heartburn. Monitor for new symptoms. Follow-up as needed.  -     DG Chest 2 View; Future  Enlarged thyroid  gland ? Thyroid  nodule. We discussed findings. He agrees with thyroid  US  and TSH.  -     TSH; Future -     US  THYROID ; Future  Return in 1 year (on 07/03/2025) for CPE, chronic problems, Labs.  I,Emily Lagle,acting as a Neurosurgeon for Prescilla Monger [place], MD.,have documented all relevant documentation on the behalf of Elmin Wiederholt [place], MD,as directed by  Daya Dutt [place], MD while in the presence of Claborn Janusz [place], MD.  I, Madai Nuccio [place], MD, have reviewed all documentation for this visit. The documentation  on 07/03/24 for the exam, diagnosis, procedures, and orders are all accurate and complete. Riordan Walle G. [place], MD  [facility]. [place] office.

## 2024-07-03 NOTE — Assessment & Plan Note (Signed)
 We discussed the importance of regular physical activity and healthy diet for prevention of chronic illness and/or complications. Preventive guidelines reviewed. Vaccination: Declined vaccination.  Prevnar 20 and Shingrix are due. Inquiring about osteoporosis screening in men. We reviewed current recommendations from USPSTF as well as other groups (endocrinology society). Prefers to hold on DEXA but would like to have it done next year. Next CPE in a year.

## 2024-07-03 NOTE — Assessment & Plan Note (Signed)
Non pharmacologic treatment to continue. Further recommendations will be given according to 10 years CVD risk score and lipid panel numbers. 

## 2024-07-04 ENCOUNTER — Inpatient Hospital Stay
Admission: RE | Admit: 2024-07-04 | Discharge: 2024-07-04 | Discharge status: Home / Self Care | Source: Ambulatory Visit | Attending: Family Medicine | Admitting: Family Medicine

## 2024-07-04 DIAGNOSIS — E049 Nontoxic goiter, unspecified: Secondary | ICD-10-CM

## 2024-07-05 ENCOUNTER — Ambulatory Visit (INDEPENDENT_AMBULATORY_CARE_PROVIDER_SITE_OTHER)

## 2024-07-05 ENCOUNTER — Other Ambulatory Visit

## 2024-07-05 DIAGNOSIS — R053 Chronic cough: Secondary | ICD-10-CM | POA: Diagnosis not present
